# Patient Record
Sex: Male | Born: 1985 | Race: White | Hispanic: No | Marital: Single | Smoking: Current some day smoker
Health system: Southern US, Community
[De-identification: ages and names within clinical notes are randomized; demographics above are authoritative.]

## PROBLEM LIST (undated history)

## (undated) DIAGNOSIS — R079 Chest pain, unspecified: Secondary | ICD-10-CM

## (undated) DIAGNOSIS — R011 Cardiac murmur, unspecified: Secondary | ICD-10-CM

## (undated) DIAGNOSIS — I10 Essential (primary) hypertension: Secondary | ICD-10-CM

## (undated) DIAGNOSIS — K219 Gastro-esophageal reflux disease without esophagitis: Secondary | ICD-10-CM

## (undated) HISTORY — DX: Chest pain, unspecified: R07.9

## (undated) HISTORY — DX: Essential (primary) hypertension: I10

## (undated) HISTORY — DX: Cardiac murmur, unspecified: R01.1

---

## 2010-03-12 ENCOUNTER — Emergency Department (HOSPITAL_COMMUNITY): Admission: EM | Admit: 2010-03-12 | Discharge: 2010-03-12 | Payer: Self-pay | Admitting: Emergency Medicine

## 2010-08-29 LAB — DIFFERENTIAL
Basophils Relative: 1 % (ref 0–1)
Eosinophils Absolute: 0.2 10*3/uL (ref 0.0–0.7)
Eosinophils Relative: 3 % (ref 0–5)
Lymphs Abs: 2.3 10*3/uL (ref 0.7–4.0)
Monocytes Relative: 8 % (ref 3–12)

## 2010-08-29 LAB — BASIC METABOLIC PANEL
CO2: 27 mEq/L (ref 19–32)
Calcium: 10 mg/dL (ref 8.4–10.5)
Chloride: 106 mEq/L (ref 96–112)
Creatinine, Ser: 0.79 mg/dL (ref 0.4–1.5)
GFR calc Af Amer: >60 mL/min (ref >60)
Glucose, Bld: 122 mg/dL — ABNORMAL HIGH (ref 70–99)

## 2010-08-29 LAB — CBC
MCH: 28.9 pg (ref 26.0–34.0)
MCHC: 34.2 g/dL (ref 30.0–36.0)
MCV: 84.5 fL (ref 78.0–100.0)
Platelets: 230 10*3/uL (ref 150–400)
RBC: 5.29 MIL/uL (ref 4.22–5.81)

## 2010-08-29 LAB — POCT CARDIAC MARKERS

## 2013-03-09 ENCOUNTER — Encounter: Payer: Self-pay | Admitting: Cardiology

## 2013-03-09 ENCOUNTER — Ambulatory Visit (INDEPENDENT_AMBULATORY_CARE_PROVIDER_SITE_OTHER): Payer: 59 | Admitting: Cardiology

## 2013-03-09 VITALS — BP 140/96 | HR 88 | Ht 70.0 in | Wt 157.8 lb

## 2013-03-09 DIAGNOSIS — R079 Chest pain, unspecified: Secondary | ICD-10-CM

## 2013-03-09 NOTE — Progress Notes (Signed)
Patient ID: Cody Fisher, male   DOB: [birth year], 27 y.o.   MRN: [id number]    Patient Name: Cody Fisher Date of Encounter: 03/09/2013  Primary Care Provider:  Cain Saupe, MD Primary Cardiologist:  Tobias Alexander, H  Patient Profile  A pleasant 27 year old who is coming for chest pain. The pain started 3 years ago when he went to the ER. He was sent home with Ibuprofen for presumed pericarditis that gave him some relief. He doesn't recall any viral infection or fevers prior to this episode. He has been having the same pains again and they come in cluster of weeks. They are on and off every few minute, sharp precordial, or left sided, non-radiating, not associated with SOB or palpitations. They only last few seconds, and are worse at night.   Problem List   Past Medical History  Diagnosis Date  . Chest pain   . Hypertension     Borderline  . Heart murmur     As a Child   History reviewed. No pertinent past surgical history.  Allergies  No Known Allergies  HPI  o syncopal episode. The patient is active, riding bike to work. He is watching his diet closely.  Home Medications  Prior to Admission medications   Medication Sig Start Date End Date Taking? Authorizing Provider  acetaminophen (TYLENOL) 325 MG tablet Take 650 mg by mouth as needed for pain.   Yes Historical Provider, MD  aspirin 325 MG tablet Take 325 mg by mouth as needed for pain.   Yes Historical Provider, MD  Calcium Carbonate Antacid (TUMS ULTRA PO) Take by mouth as needed.   Yes Historical Provider, MD  dexlansoprazole (DEXILANT) 60 MG capsule Take 60 mg by mouth daily.   Yes Historical Provider, MD    Family History  History reviewed. No pertinent family history.  Social History  History   Social History  . Marital Status: Single    Spouse Name: N/A    Number of Children: N/A  . Years of Education: N/A   Occupational History  . Not on file.   Social History Main Topics  . Smoking  status: Former Games developer  . Smokeless tobacco: Not on file     Comment: Quit 6 months ago 2014  . Alcohol Use: Not on file  . Drug Use: Not on file  . Sexual Activity: Not on file   Other Topics Concern  . Not on file   Social History Narrative  . No narrative on file     Review of Systems General:  No chills, fever, night sweats or weight changes.  Cardiovascular:  + chest pain, dyspnea on exertion, edema, orthopnea, + palpitations, paroxysmal nocturnal dyspnea. Dermatological: No rash, lesions/masses Respiratory: No cough, dyspnea Urologic: No hematuria, dysuria Abdominal:   No nausea, vomiting, diarrhea, bright red blood per rectum, melena, or hematemesis Neurologic:  No visual changes, wkns, changes in mental status. All other systems reviewed and are otherwise negative except as noted above.  Physical Exam  Blood pressure 140/96, pulse 88, height 5\' 10"  (1.778 m), weight 157 lb 12.8 oz (71.578 kg).  General: Pleasant, NAD Psych: Normal affect. Neuro: Alert and oriented X 3. Moves all extremities spontaneously. HEENT: Normal  Neck: Supple without bruits or JVD. Lungs:  Resp regular and unlabored, CTA. Heart: RRR no s3, s4, or murmurs. Abdomen: Soft, non-tender, non-distended, BS + x 4.  Extremities: No clubbing, cyanosis or edema. DP/PT/Radials 2+ and equal bilaterally.  Accessory Clinical Findings  ECG -  SR, 72 BPM, early repolarization in lateral leads   Assessment & Plan  27 year old male  1. Atypical chest pain - the character is concerning for possible pericarditis vs GERD. There got some relief from Ibuprofen 3 years ago and now some relief from PPIs. Both diseases get worse at supine position. We will continue PPIs and  Order a TTE to assess for possible pericardial disease.  2. Hypertension - elevated on 2 separate doctors visits, he is asked to check as often as possible until the next visit. If elevated at the next appointment we will start him on BP  medication. He was counseled on proper diet and exercise.  3. Elevated glucose - on 1 occasion, the patient is concerned as many family members with diabetes. We will recheck glucose and HbA1c.  Follow up in 3 weeks    Tobias Alexander, Rexene Edison, MD 03/09/2013, 4:00 PM

## 2013-03-09 NOTE — Patient Instructions (Addendum)
Your physician recommends that have for lab work today: BMET/A1c  Your physician has requested that you have an echocardiogram. Echocardiography is a painless test that uses sound waves to create images of your heart. It provides your doctor with information about the size and shape of your heart and how well your heart's chambers and valves are working. This procedure takes approximately one hour. There are no restrictions for this procedure.  Your physician recommends that you schedule a follow-up appointment in: 3 weeks with Dr. Delton See  Your physician recommends that you continue on your current medications as directed. Please refer to the Current Medication list given to you today.

## 2013-03-10 LAB — BASIC METABOLIC PANEL
BUN: 12 mg/dL (ref 6–23)
CO2: 29 mEq/L (ref 19–32)
Calcium: 9.8 mg/dL (ref 8.4–10.5)
Chloride: 106 mEq/L (ref 96–112)
Creatinine, Ser: 0.9 mg/dL (ref 0.4–1.5)
GFR: 110.43 mL/min (ref >60.00)
Glucose, Bld: 83 mg/dL (ref 70–99)
Potassium: 4.2 mEq/L (ref 3.5–5.1)
Sodium: 140 mEq/L (ref 135–145)

## 2013-03-10 LAB — HEMOGLOBIN A1C: Hgb A1c MFr Bld: 5.6 % (ref 4.6–6.5)

## 2013-03-22 ENCOUNTER — Other Ambulatory Visit (HOSPITAL_COMMUNITY): Payer: Self-pay | Admitting: Cardiology

## 2013-03-22 ENCOUNTER — Ambulatory Visit (HOSPITAL_COMMUNITY): Payer: 59 | Attending: Cardiology | Admitting: Radiology

## 2013-03-22 DIAGNOSIS — R072 Precordial pain: Secondary | ICD-10-CM | POA: Insufficient documentation

## 2013-03-22 DIAGNOSIS — I10 Essential (primary) hypertension: Secondary | ICD-10-CM | POA: Insufficient documentation

## 2013-03-22 DIAGNOSIS — R079 Chest pain, unspecified: Secondary | ICD-10-CM

## 2013-03-22 NOTE — Progress Notes (Signed)
Echocardiogram performed.  

## 2013-03-29 ENCOUNTER — Encounter: Payer: Self-pay | Admitting: Cardiology

## 2013-04-01 ENCOUNTER — Ambulatory Visit: Payer: 59 | Admitting: Cardiology

## 2013-04-06 ENCOUNTER — Ambulatory Visit: Payer: 59 | Admitting: Cardiology

## 2013-04-11 ENCOUNTER — Encounter: Payer: Self-pay | Admitting: Cardiology

## 2013-04-11 ENCOUNTER — Ambulatory Visit (INDEPENDENT_AMBULATORY_CARE_PROVIDER_SITE_OTHER): Payer: 59 | Admitting: Cardiology

## 2013-04-11 VITALS — BP 168/48 | HR 94 | Ht 70.0 in | Wt 167.0 lb

## 2013-04-11 DIAGNOSIS — R079 Chest pain, unspecified: Secondary | ICD-10-CM

## 2013-04-11 MED ORDER — AMLODIPINE BESYLATE 2.5 MG PO TABS: 2.5000 mg | ORAL_TABLET | Freq: Every day | ORAL | Status: DC

## 2013-04-11 NOTE — Patient Instructions (Signed)
**Note De-Identified Cody Fisher Obfuscation** Your physician has requested that you have an exercise tolerance test. For further information please visit [email or web address] Please also follow instruction sheet, as given.  Your physician has recommended you make the following change in your medication: start taking Amlodipine 2.5 mg daily  Your physician recommends that you schedule a follow-up appointment in: after GXT

## 2013-04-11 NOTE — Progress Notes (Signed)
Patient ID: Cody Fisher, male   DOB: [birth year], 27 y.o.   MRN: [id number]  Patient ID: Cody Fisher, male   DOB: [birth year], 27 y.o.   MRN: [id number]    Patient Name: Cody Fisher Date of Encounter: 04/11/2013  Primary Care Provider:  Cain Saupe, MD Primary Cardiologist:  Tobias Alexander, H  Patient Profile  A pleasant 27 year old who is coming for chest pain. The pain started 3 years ago when he went to the ER. He was sent home with Ibuprofen for presumed pericarditis that gave him some relief. He doesn't recall any viral infection or fevers prior to this episode. He has been having the same pains again and they come in cluster of weeks. They are on and off every few minute, sharp precordial, or left sided, non-radiating, not associated with SOB or palpitations. They only last few seconds, and are worse at night.   Problem List   Past Medical History  Diagnosis Date  . Chest pain   . Hypertension     Borderline  . Heart murmur     As a Child   No past surgical history on file.  Allergies  No Known Allergies  HPI  A pleasant 27 year old who is coming for chest pain. The pain started 3 years ago when he went to the ER. He was sent home with Ibuprofen for presumed pericarditis that gave him some relief. He doesn't recall any viral infection or fevers prior to this episode. He has been having the same pains again and they come in cluster of weeks. They are on and off every few minute, sharp precordial, or left sided, non-radiating, not associated with SOB or palpitations. They only last few seconds, and are worse at night.   Follow up after 3 weeks, no more episodes of chest pain.   Home Medications  Prior to Admission medications   Medication Sig Start Date End Date Taking? Authorizing Provider  acetaminophen (TYLENOL) 325 MG tablet Take 650 mg by mouth as needed for pain.   Yes Historical Provider, MD  aspirin 325 MG tablet Take 325 mg by mouth as needed for pain.    Yes Historical Provider, MD  Calcium Carbonate Antacid (TUMS ULTRA PO) Take by mouth as needed.   Yes Historical Provider, MD  dexlansoprazole (DEXILANT) 60 MG capsule Take 60 mg by mouth daily.   Yes Historical Provider, MD    Family History  No family history on file.  Social History  History   Social History  . Marital Status: Single    Spouse Name: N/A    Number of Children: N/A  . Years of Education: N/A   Occupational History  . Not on file.   Social History Main Topics  . Smoking status: Former Games developer  . Smokeless tobacco: Not on file     Comment: Quit 6 months ago 2014  . Alcohol Use: Not on file  . Drug Use: Not on file  . Sexual Activity: Not on file   Other Topics Concern  . Not on file   Social History Narrative  . No narrative on file     Review of Systems General:  No chills, fever, night sweats or weight changes.  Cardiovascular:  + chest pain, dyspnea on exertion, edema, orthopnea, + palpitations, paroxysmal nocturnal dyspnea. Dermatological: No rash, lesions/masses Respiratory: No cough, dyspnea Urologic: No hematuria, dysuria Abdominal:   No nausea, vomiting, diarrhea, bright red blood per rectum, melena, or hematemesis Neurologic:  No visual  changes, wkns, changes in mental status. All other systems reviewed and are otherwise negative except as noted above.  Physical Exam  Blood pressure 168/48, pulse 94, height 5\' 10"  (1.778 m), weight 167 lb (75.751 kg).  General: Pleasant, NAD Psych: Normal affect. Neuro: Alert and oriented X 3. Moves all extremities spontaneously. HEENT: Normal  Neck: Supple without bruits or JVD. Lungs:  Resp regular and unlabored, CTA. Heart: RRR no s3, s4, or murmurs. Abdomen: Soft, non-tender, non-distended, BS + x 4.  Extremities: No clubbing, cyanosis or edema. DP/PT/Radials 2+ and equal bilaterally.  Accessory Clinical Findings  ECG - SR, 72 BPM, early repolarization in lateral leads  TTE  03/22/2013 ------------------------------------------------------------ Left ventricle: The cavity size was normal. Wall thickness was normal. Systolic function was normal. The estimated ejection fraction was in the range of 60% to 65%. Wall motion was normal; there were no regional wall motion abnormalities. The transmitral flow pattern was normal. The deceleration time of the early transmitral flow velocity was normal. The pulmonary vein flow pattern was normal. The tissue Doppler parameters were normal. Left ventricular diastolic function parameters were normal. ------------------------------------------------------------ Aortic valve: Structurally normal valve. Cusp separation was normal. Doppler: Transvalvular velocity was within the normal range. There was no stenosis. No significant regurgitation. ------------------------------------------------------------ Aorta: The aorta was normal, not dilated, and non-diseased. ------------------------------------------------------------ Mitral valve: Structurally normal valve. Leaflet separation was normal. Doppler: Transvalvular velocity was within the normal range. There was no evidence for stenosis. No significant regurgitation. Peak gradient: 3mm Hg (D). ------------------------------------------------------------ Left atrium: The atrium was normal in size. ------------------------------------------------------------ Atrial septum: No defect or patent foramen ovale was identified. ------------------------------------------------------------ Right ventricle: The cavity size was normal. Systolic function was normal. ------------------------------------------------------------ Pulmonic valve: Structurally normal valve. Cusp separation was normal. Doppler: Transvalvular velocity was within the normal range. No significant regurgitation. ------------------------------------------------------------ Tricuspid valve: Structurally normal  valve. Leaflet separation was normal. Doppler: Transvalvular velocity was within the normal range. Trivial regurgitation.  ------------------------------------------------------------ Pulmonary artery: Systolic pressure was within the normal range. ------------------------------------------------------------ Right atrium: The atrium was normal in size. ------------------------------------------------------------ Pericardium: There was no pericardial effusion. ------------------------------------------------------------ Systemic veins: Inferior vena cava: The vessel was normal in size; the respirophasic diameter changes were in the normal range (= 50%); findings are consistent with normal central venous pressure. ------------------------------------------------------------ Post procedure conclusions Ascending Aorta: - The aorta was normal, not dilated, and non-diseased.    Assessment & Plan  27 year old male  1. Hypertension - elevated on 3 separate doctors visits including today. The patient arrived on a bike, however the BO checked 20 minutes after his arrival and still elevated at 168/48. He was counseled on proper diet and exercise at the last visit. In fact, the patient rarely eats out and prepares healthy food on his own. He also exercises on a regular basis. We will start the patient on amlodipine 2.5 mg po daily and schedule an exercise treadmill test 1 month from now to evaluate for the blood pressure response with exercise.  2. Atypical chest pain - the character is concerning for possible pericarditis vs GERD. Now resolved. We will continue PPIs. No pericardial effusion on TTE.Marland Kitchen Elevated glucose - on 1 occasion, the patient is concerned as many family members with diabetes. HbA1c normal  Follow up in 6 weeks.  Tobias Alexander, Rexene Edison, MD 04/11/2013, 2:31 PM

## 2013-05-17 ENCOUNTER — Ambulatory Visit (INDEPENDENT_AMBULATORY_CARE_PROVIDER_SITE_OTHER): Payer: 59 | Admitting: Physician Assistant

## 2013-05-17 DIAGNOSIS — R079 Chest pain, unspecified: Secondary | ICD-10-CM

## 2013-05-17 NOTE — Progress Notes (Signed)
Exercise Treadmill Test  Pre-Exercise Testing Evaluation Rhythm: sinus tachycardia  Rate: 100 bpm     Test  Exercise Tolerance Test Ordering MD: Tobias Alexander, MD  Interpreting MD: Tereso Newcomer, PA-C  Unique Test No: 1  Treadmill:  1  Indication for ETT: chest pain - rule out ischemia  Contraindication to ETT: No   Stress Modality: exercise - treadmill  Cardiac Imaging Performed: non   Protocol: standard Bruce - maximal  Max BP:  204/63  Max MPHR (bpm):  193 85% MPR (bpm):  164  MPHR obtained (bpm):  193 % MPHR obtained:  100  Reached 85% MPHR (min:sec):  9:45 Total Exercise Time (min-sec):  12:00  Workload in METS:  13.4 Borg Scale: 15  Reason ETT Terminated:  desired heart rate attained    ST Segment Analysis At Rest: normal ST segments - no evidence of significant ST depression With Exercise: no evidence of significant ST depression  Other Information Arrhythmia:  No Angina during ETT:  absent (0) Quality of ETT:  diagnostic  ETT Interpretation:  normal - no evidence of ischemia by ST analysis  Comments: Excellent exercise capacity. No chest pain. Normal BP response to exercise.  Baseline BP 140/87 => 204/63 (at peak exercise). No ST changes to suggest ischemia.   Recommendations: F/u with Dr. Tobias Alexander as directed. Signed,  Tereso Newcomer, PA-C   05/17/2013 3:36 PM

## 2013-06-03 ENCOUNTER — Encounter: Payer: Self-pay | Admitting: Cardiology

## 2013-06-03 ENCOUNTER — Ambulatory Visit (INDEPENDENT_AMBULATORY_CARE_PROVIDER_SITE_OTHER): Payer: 59 | Admitting: Cardiology

## 2013-06-03 VITALS — BP 145/85 | HR 81 | Ht 70.0 in | Wt 174.0 lb

## 2013-06-03 DIAGNOSIS — I10 Essential (primary) hypertension: Secondary | ICD-10-CM

## 2013-06-03 DIAGNOSIS — R079 Chest pain, unspecified: Secondary | ICD-10-CM

## 2013-06-03 HISTORY — DX: Essential (primary) hypertension: I10

## 2013-06-03 MED ORDER — AMLODIPINE BESYLATE 5 MG PO TABS: 5.0000 mg | ORAL_TABLET | Freq: Every day | ORAL | Status: DC

## 2013-06-03 NOTE — Patient Instructions (Signed)
Increase Amlodipine to 5 mg daily   Your physician recommends that you schedule a follow-up appointment in: 2 months 07/20/13 at 2:00 pm

## 2013-06-03 NOTE — Progress Notes (Signed)
Patient ID: Cody Fisher, male   DOB: January 09, 1986, 27 y.o.   MRN: [id number]     Patient Name: Cody Fisher Date of Encounter: 06/03/2013  Primary Care Provider:  Cain Saupe, MD Primary Cardiologist:  Tobias Alexander, H  Patient Profile  A pleasant 27 year old who is coming for chest pain. The pain started 3 years ago when he went to the ER. He was sent home with Ibuprofen for presumed pericarditis that gave him some relief. He doesn't recall any viral infection or fevers prior to this episode. He has been having the same pains again and they come in cluster of weeks. They are on and off every few minute, sharp precordial, or left sided, non-radiating, not associated with SOB or palpitations. They only last few seconds, and are worse at night.   Problem List   Past Medical History  Diagnosis Date  . Chest pain   . Hypertension     Borderline  . Heart murmur     As a Child   No past surgical history on file.  Allergies  No Known Allergies  HPI  A pleasant 27 year old who is coming for chest pain. The pain started 3 years ago when he went to the ER. He was sent home with Ibuprofen for presumed pericarditis that gave him some relief. He doesn't recall any viral infection or fevers prior to this episode. He has been having the same pains again and they come in cluster of weeks. They are on and off every few minute, sharp precordial, or left sided, non-radiating, not associated with SOB or palpitations. They only last few seconds, and are worse at night.   Follow up after 6 weeks, no more episodes of chest pain. The patient started strenous exercise - daily alternating cardio and weight lifting. In addition he bikes daily.   Home Medications  Prior to Admission medications   Medication Sig Start Date End Date Taking? Authorizing Provider  acetaminophen (TYLENOL) 325 MG tablet Take 650 mg by mouth as needed for pain.   Yes Historical Provider, MD  aspirin 325 MG tablet Take  325 mg by mouth as needed for pain.   Yes Historical Provider, MD  Calcium Carbonate Antacid (TUMS ULTRA PO) Take by mouth as needed.   Yes Historical Provider, MD  dexlansoprazole (DEXILANT) 60 MG capsule Take 60 mg by mouth daily.   Yes Historical Provider, MD    Family History  No family history on file.  Social History  History   Social History  . Marital Status: Single    Spouse Name: N/A    Number of Children: N/A  . Years of Education: N/A   Occupational History  . Not on file.   Social History Main Topics  . Smoking status: Former Games developer  . Smokeless tobacco: Not on file     Comment: Quit 6 months ago 2014  . Alcohol Use: Not on file  . Drug Use: Not on file  . Sexual Activity: Not on file   Other Topics Concern  . Not on file   Social History Narrative  . No narrative on file     Review of Systems General:  No chills, fever, night sweats or weight changes.  Cardiovascular:  + chest pain, dyspnea on exertion, edema, orthopnea, + palpitations, paroxysmal nocturnal dyspnea. Dermatological: No rash, lesions/masses Respiratory: No cough, dyspnea Urologic: No hematuria, dysuria Abdominal:   No nausea, vomiting, diarrhea, bright red blood per rectum, melena, or hematemesis Neurologic:  No visual changes, wkns, changes in mental status. All other systems reviewed and are otherwise negative except as noted above.  Physical Exam  BP 145/85, 81 BPM General: Pleasant, NAD Psych: Normal affect. Neuro: Alert and oriented X 3. Moves all extremities spontaneously. HEENT: Normal  Neck: Supple without bruits or JVD. Lungs:  Resp regular and unlabored, CTA. Heart: RRR no s3, s4, or murmurs. Abdomen: Soft, non-tender, non-distended, BS + x 4.  Extremities: No clubbing, cyanosis or edema. DP/PT/Radials 2+ and equal bilaterally.  Accessory Clinical Findings  ECG - SR, 72 BPM, early repolarization in lateral leads  TTE  03/22/2013 ------------------------------------------------------------ Left ventricle: The cavity size was normal. Wall thickness was normal. Systolic function was normal. The estimated ejection fraction was in the range of 60% to 65%. Wall motion was normal; there were no regional wall motion abnormalities. The transmitral flow pattern was normal. The deceleration time of the early transmitral flow velocity was normal. The pulmonary vein flow pattern was normal. The tissue Doppler parameters were normal. Left ventricular diastolic function parameters were normal. ------------------------------------------------------------ Aortic valve: Structurally normal valve. Cusp separation was normal. Doppler: Transvalvular velocity was within the normal range. There was no stenosis. No significant regurgitation. ------------------------------------------------------------ Aorta: The aorta was normal, not dilated, and non-diseased. ------------------------------------------------------------ Mitral valve: Structurally normal valve. Leaflet separation was normal. Doppler: Transvalvular velocity was within the normal range. There was no evidence for stenosis. No significant regurgitation. Peak gradient: 3mm Hg (D). ------------------------------------------------------------ Left atrium: The atrium was normal in size. ------------------------------------------------------------ Atrial septum: No defect or patent foramen ovale was identified. ------------------------------------------------------------ Right ventricle: The cavity size was normal. Systolic function was normal. ------------------------------------------------------------ Pulmonic valve: Structurally normal valve. Cusp separation was normal. Doppler: Transvalvular velocity was within the normal range. No significant regurgitation. ------------------------------------------------------------ Tricuspid valve: Structurally normal  valve. Leaflet separation was normal. Doppler: Transvalvular velocity was within the normal range. Trivial regurgitation.  ------------------------------------------------------------ Pulmonary artery: Systolic pressure was within the normal range. ------------------------------------------------------------ Right atrium: The atrium was normal in size. ------------------------------------------------------------ Pericardium: There was no pericardial effusion. ------------------------------------------------------------ Systemic veins: Inferior vena cava: The vessel was normal in size; the respirophasic diameter changes were in the normal range (= 50%); findings are consistent with normal central venous pressure. ------------------------------------------------------------ Post procedure conclusions Ascending Aorta: - The aorta was normal, not dilated, and non-diseased.   Exercise treadmill stress test: 05/17/2013 ETT Interpretation:  normal - no evidence of ischemia by ST analysis  Comments: Excellent exercise capacity. No chest pain. Normal BP response to exercise.  Baseline BP 140/87 => 204/63 (at peak exercise). No ST changes to suggest ischemia.   Recommendations: F/u with Dr. Tobias Alexander as directed. Signed,  Tereso Newcomer, PA-C   05/17/2013 3:36 PM      Assessment & Plan  27 year old male  1. Hypertension - elevated on 3 separate doctors visits including today. The patient arrived on a bike, however the BO checked 20 minutes after his arrival and still elevated at 168/48. He was counseled on proper diet and exercise at the last visit. In fact, the patient rarely eats out and prepares healthy food on his own. He also exercises on a regular basis.  We started the patient on amlodipine 2.5 mg po daily and he underwent an exercise treadmill test 1 month later that showed baseline BP 140/87 => 204/63 (at peak exercise).   We will increase amlodipine to 5 mg po daily.  We will follow up in 6 weeks.  Repeat treadmill test to assess BP response at  stress in 3 months (after 3 monrhs of strenuous exercise).  He will bring BP diary from different times of the day and from gym.  2. Atypical chest pain - the character is concerning for possible pericarditis vs GERD. Now resolved. We will continue PPIs. No pericardial effusion on TTE.Marland Kitchen Elevated glucose - on 1 occasion, the patient is concerned as many family members with diabetes. HbA1c normal. No ischemia on exercise treadmill stress test.  Follow up in 6 weeks.  Tobias Alexander, Rexene Edison, MD 06/03/2013, 12:17 PM

## 2013-07-12 ENCOUNTER — Encounter: Payer: Self-pay | Admitting: *Deleted

## 2013-07-20 ENCOUNTER — Ambulatory Visit (INDEPENDENT_AMBULATORY_CARE_PROVIDER_SITE_OTHER): Payer: 59 | Admitting: Cardiology

## 2013-07-20 ENCOUNTER — Encounter: Payer: Self-pay | Admitting: Cardiology

## 2013-07-20 VITALS — BP 128/70 | HR 61 | Ht 70.0 in | Wt 169.8 lb

## 2013-07-20 DIAGNOSIS — I10 Essential (primary) hypertension: Secondary | ICD-10-CM

## 2013-07-20 NOTE — Progress Notes (Signed)
Patient ID: Kadon Andrus, male   DOB: [birth year], 28 y.o.   MRN: [id number]     Patient Name: Adams Hinch Date of Encounter: 07/20/2013  Primary Care Provider:  Cain Saupe, MD Primary Cardiologist:  Tobias Alexander, H  Patient Profile  A pleasant 28 year old who is coming for chest pain. The pain started 3 years ago when he went to the ER. He was sent home with Ibuprofen for presumed pericarditis that gave him some relief. He doesn't recall any viral infection or fevers prior to this episode. He has been having the same pains again and they come in cluster of weeks. They are on and off every few minute, sharp precordial, or left sided, non-radiating, not associated with SOB or palpitations. They only last few seconds, and are worse at night.   Problem List   Past Medical History  Diagnosis Date  . Chest pain   . Hypertension     Borderline  . Heart murmur     As a Child  . Essential hypertension 06/03/2013   No past surgical history on file.  Allergies  No Known Allergies  HPI  A pleasant 28 year old who is coming for chest pain. The pain started 3 years ago when he went to the ER. He was sent home with Ibuprofen for presumed pericarditis that gave him some relief. He doesn't recall any viral infection or fevers prior to this episode. He has been having the same pains again and they come in cluster of weeks. They are on and off every few minute, sharp precordial, or left sided, non-radiating, not associated with SOB or palpitations. They only last few seconds, and are worse at night.   Follow up after 6 weeks, no more episodes of chest pain. The patient started strenous exercise - daily alternating cardio and weight lifting. In addition he bikes daily.  The patient is coming after one months he states that he feels great. He started an intense exercise program where every other day he exercises for about an hour, he alternates resistance training for about half an hour with  5K running for half an hour. On weekends he runs 10 K. The patient inquiries about further exercise training he would like to run a half Marathon and potentially full Marathon later this year. He wants more information with regards to his heart disease and high blood pressure.  Home Medications  Prior to Admission medications   Medication Sig Start Date End Date Taking? Authorizing Provider  acetaminophen (TYLENOL) 325 MG tablet Take 650 mg by mouth as needed for pain.   Yes Historical Provider, MD  aspirin 325 MG tablet Take 325 mg by mouth as needed for pain.   Yes Historical Provider, MD  Calcium Carbonate Antacid (TUMS ULTRA PO) Take by mouth as needed.   Yes Historical Provider, MD  dexlansoprazole (DEXILANT) 60 MG capsule Take 60 mg by mouth daily.   Yes Historical Provider, MD    Family History  Family History  Problem Relation Age of Onset  . Diabetes      family history    Social History  History   Social History  . Marital Status: Single    Spouse Name: N/A    Number of Children: N/A  . Years of Education: N/A   Occupational History  . Not on file.   Social History Main Topics  . Smoking status: Former Games developer  . Smokeless tobacco: Never Used     Comment: Quit 2014  .  Alcohol Use: No  . Drug Use: No  . Sexual Activity: Not on file   Other Topics Concern  . Not on file   Social History Narrative  . No narrative on file     Review of Systems General:  No chills, fever, night sweats or weight changes.  Cardiovascular:  + chest pain, dyspnea on exertion, edema, orthopnea, + palpitations, paroxysmal nocturnal dyspnea. Dermatological: No rash, lesions/masses Respiratory: No cough, dyspnea Urologic: No hematuria, dysuria Abdominal:   No nausea, vomiting, diarrhea, bright red blood per rectum, melena, or hematemesis Neurologic:  No visual changes, wkns, changes in mental status. All other systems reviewed and are otherwise negative except as noted  above.  Physical Exam  BP 145/85, 81 BPM General: Pleasant, NAD Psych: Normal affect. Neuro: Alert and oriented X 3. Moves all extremities spontaneously. HEENT: Normal  Neck: Supple without bruits or JVD. Lungs:  Resp regular and unlabored, CTA. Heart: RRR no s3, s4, or murmurs. Abdomen: Soft, non-tender, non-distended, BS + x 4.  Extremities: No clubbing, cyanosis or edema. DP/PT/Radials 2+ and equal bilaterally.  Accessory Clinical Findings  ECG - SR, 72 BPM, early repolarization in lateral leads  TTE 03/22/2013 ------------------------------------------------------------ Left ventricle: The cavity size was normal. Wall thickness was normal. Systolic function was normal. The estimated ejection fraction was in the range of 60% to 65%. Wall motion was normal; there were no regional wall motion abnormalities. The transmitral flow pattern was normal. The deceleration time of the early transmitral flow velocity was normal. The pulmonary vein flow pattern was normal. The tissue Doppler parameters were normal. Left ventricular diastolic function parameters were normal. ------------------------------------------------------------ Aortic valve: Structurally normal valve. Cusp separation was normal. Doppler: Transvalvular velocity was within the normal range. There was no stenosis. No significant regurgitation. ------------------------------------------------------------ Aorta: The aorta was normal, not dilated, and non-diseased. ------------------------------------------------------------ Mitral valve: Structurally normal valve. Leaflet separation was normal. Doppler: Transvalvular velocity was within the normal range. There was no evidence for stenosis. No significant regurgitation. Peak gradient: 3mm Hg (D). ------------------------------------------------------------ Left atrium: The atrium was normal in size. ------------------------------------------------------------ Atrial  septum: No defect or patent foramen ovale was identified. ------------------------------------------------------------ Right ventricle: The cavity size was normal. Systolic function was normal. ------------------------------------------------------------ Pulmonic valve: Structurally normal valve. Cusp separation was normal. Doppler: Transvalvular velocity was within the normal range. No significant regurgitation. ------------------------------------------------------------ Tricuspid valve: Structurally normal valve. Leaflet separation was normal. Doppler: Transvalvular velocity was within the normal range. Trivial regurgitation.  ------------------------------------------------------------ Pulmonary artery: Systolic pressure was within the normal range. ------------------------------------------------------------ Right atrium: The atrium was normal in size. ------------------------------------------------------------ Pericardium: There was no pericardial effusion. ------------------------------------------------------------ Systemic veins: Inferior vena cava: The vessel was normal in size; the respirophasic diameter changes were in the normal range (= 50%); findings are consistent with normal central venous pressure. ------------------------------------------------------------ Post procedure conclusions Ascending Aorta: - The aorta was normal, not dilated, and non-diseased.   Exercise treadmill stress test: 05/17/2013 ETT Interpretation:  normal - no evidence of ischemia by ST analysis  Comments: Excellent exercise capacity. No chest pain. Normal BP response to exercise.  Baseline BP 140/87 => 204/63 (at peak exercise). No ST changes to suggest ischemia.   Recommendations: F/u with Dr. Tobias Alexander as directed. Signed,  Tereso Newcomer, PA-C   05/17/2013 3:36 PM      Assessment & Plan  28 year old male  1. Hypertension - elevated on 3 separate doctors visits  including today. The patient arrived on a bike, however the BO checked 20 minutes after  his arrival and still elevated at 168/48. He was counseled on proper diet and exercise at the last visit. In fact, the patient rarely eats out and prepares healthy food on his own. He also exercises on a regular basis.  We started the patient on amlodipine 2.5 mg po daily and he underwent an exercise treadmill test 1 month later that showed baseline BP 140/87 => 204/63 (at peak exercise).   BP controlled with amlodipine, improvement of symptoms.   BP diary shows 120-140/80-90 range.   I had a lengthy discussion with the patient in regards to proper training and preparing for half Marathon. Since the patient had normal echocardiogram with no LVH, he is encouraged to further running and exercising in general. Before he runs his half marathon we might consider performing another treadmill test to see how his blood pressure responses to high level of exercise. I expected his blood pressure will further improved with her regular exercise. The patient will contact us in about 6 months before he runs his first long phrase  2. Atypical chest pain - the character is concerning for possible pericarditis vs GERD. Now resolved. We will continue PPIs. No pericardial effusion on TTE.Marland Kitchen. Elevated glucose - on 1 occasion, the patient is concerned as many family members with diabetes. HbA1c normal. No ischemia on exercise treadmill stress test.  Tobias AlexanderNELSON, Lafonda Patron, Rexene EdisonH, MD 07/20/2013, 2:18 PM

## 2013-07-20 NOTE — Patient Instructions (Signed)
**Note De-identified Freddie Nghiem Obfuscation** Your physician recommends that you continue on your current medications as directed. Please refer to the Current Medication list given to you today.  Your physician wants you to follow-up in: 6 months. You will receive a reminder letter in the mail two months in advance. If you don't receive a letter, please call our office to schedule the follow-up appointment.  

## 2013-12-09 ENCOUNTER — Other Ambulatory Visit: Payer: Self-pay | Admitting: Gastroenterology

## 2013-12-09 DIAGNOSIS — R109 Unspecified abdominal pain: Secondary | ICD-10-CM

## 2013-12-09 DIAGNOSIS — R079 Chest pain, unspecified: Secondary | ICD-10-CM

## 2013-12-15 ENCOUNTER — Encounter: Payer: Self-pay | Admitting: Cardiology

## 2013-12-15 ENCOUNTER — Ambulatory Visit (INDEPENDENT_AMBULATORY_CARE_PROVIDER_SITE_OTHER): Payer: 59 | Admitting: Cardiology

## 2013-12-15 ENCOUNTER — Encounter (INDEPENDENT_AMBULATORY_CARE_PROVIDER_SITE_OTHER): Payer: Self-pay

## 2013-12-15 VITALS — BP 133/82 | HR 70 | Ht 70.0 in | Wt 171.0 lb

## 2013-12-15 DIAGNOSIS — I10 Essential (primary) hypertension: Secondary | ICD-10-CM

## 2013-12-15 NOTE — Progress Notes (Signed)
Patient ID: Cody Fisher, male   DOB: 1985/11/27, 28 y.o.   MRN: [id number]     Patient Name: Cody Fisher Date of Encounter: 12/15/2013  Primary Care Provider:  Cain Saupe, MD Primary Cardiologist:  Lars Masson  Patient Profile  A pleasant 28 year old who is coming for chest pain. The pain started 3 years ago when he went to the ER. He was sent home with Ibuprofen for presumed pericarditis that gave him some relief. He doesn't recall any viral infection or fevers prior to this episode. He has been having the same pains again and they come in cluster of weeks. They are on and off every few minute, sharp precordial, or left sided, non-radiating, not associated with SOB or palpitations. They only last few seconds, and are worse at night.   Problem List   Past Medical History  Diagnosis Date  . Chest pain   . Hypertension     Borderline  . Heart murmur     As a Child  . Essential hypertension 06/03/2013   No past surgical history on file.  Allergies  No Known Allergies  HPI  A pleasant 28 year old who is coming for chest pain. The pain started 3 years ago when he went to the ER. He was sent home with Ibuprofen for presumed pericarditis that gave him some relief. He doesn't recall any viral infection or fevers prior to this episode. He has been having the same pains again and they come in cluster of weeks. They are on and off every few minute, sharp precordial, or left sided, non-radiating, not associated with SOB or palpitations. They only last few seconds, and are worse at night.   Follow up after 6 weeks, no more episodes of chest pain. The patient started strenous exercise - daily alternating cardio and weight lifting. In addition he bikes daily.  The patient is coming after one months he states that he feels great. He started an intense exercise program where every other day he exercises for about an hour, he alternates resistance training for about half an hour with  5K running for half an hour. On weekends he runs 10 K. The patient inquiries about further exercise training he would like to run a half Marathon and potentially full Marathon later this year. He wants more information with regards to his heart disease and high blood pressure.  12/15/2013 - resolution of chest pain since PPI started but only has been taking it for 5 days. He continues to exercise on a regular basis and is asymptomatic.   Home Medications  Prior to Admission medications   Medication Sig Start Date End Date Taking? Authorizing Provider  acetaminophen (TYLENOL) 325 MG tablet Take 650 mg by mouth as needed for pain.   Yes Historical Provider, MD  aspirin 325 MG tablet Take 325 mg by mouth as needed for pain.   Yes Historical Provider, MD  Calcium Carbonate Antacid (TUMS ULTRA PO) Take by mouth as needed.   Yes Historical Provider, MD  dexlansoprazole (DEXILANT) 60 MG capsule Take 60 mg by mouth daily.   Yes Historical Provider, MD    Family History  Family History  Problem Relation Age of Onset  . Diabetes      family history  . Hypertension Mother   . Diabetes Father     Social History  History   Social History  . Marital Status: Single    Spouse Name: N/A    Number of Children: N/A  .  Years of Education: N/A   Occupational History  . Not on file.   Social History Main Topics  . Smoking status: Former Games developermoker  . Smokeless tobacco: Never Used     Comment: Quit 2014  . Alcohol Use: No  . Drug Use: No  . Sexual Activity: Not on file   Other Topics Concern  . Not on file   Social History Narrative  . No narrative on file     Review of Systems General:  No chills, fever, night sweats or weight changes.  Cardiovascular:  + chest pain, dyspnea on exertion, edema, orthopnea, + palpitations, paroxysmal nocturnal dyspnea. Dermatological: No rash, lesions/masses Respiratory: No cough, dyspnea Urologic: No hematuria, dysuria Abdominal:   No nausea, vomiting,  diarrhea, bright red blood per rectum, melena, or hematemesis Neurologic:  No visual changes, wkns, changes in mental status. All other systems reviewed and are otherwise negative except as noted above.  Physical Exam  BP 145/85, 81 BPM General: Pleasant, NAD Psych: Normal affect. Neuro: Alert and oriented X 3. Moves all extremities spontaneously. HEENT: Normal  Neck: Supple without bruits or JVD. Lungs:  Resp regular and unlabored, CTA. Heart: RRR no s3, s4, or murmurs. Abdomen: Soft, non-tender, non-distended, BS + x 4.  Extremities: No clubbing, cyanosis or edema. DP/PT/Radials 2+ and equal bilaterally.  Accessory Clinical Findings  ECG - SR, 72 BPM, early repolarization in lateral leads  TTE 03/22/2013 ------------------------------------------------------------ Left ventricle: The cavity size was normal. Wall thickness was normal. Systolic function was normal. The estimated ejection fraction was in the range of 60% to 65%. Wall motion was normal; there were no regional wall motion abnormalities. The transmitral flow pattern was normal. The deceleration time of the early transmitral flow velocity was normal. The pulmonary vein flow pattern was normal. The tissue Doppler parameters were normal. Left ventricular diastolic function parameters were normal. ------------------------------------------------------------ Aortic valve: Structurally normal valve. Cusp separation was normal. Doppler: Transvalvular velocity was within the normal range. There was no stenosis. No significant regurgitation. ------------------------------------------------------------ Aorta: The aorta was normal, not dilated, and non-diseased. ------------------------------------------------------------ Mitral valve: Structurally normal valve. Leaflet separation was normal. Doppler: Transvalvular velocity was within the normal range. There was no evidence for stenosis. No significant regurgitation. Peak  gradient: 3mm Hg (D). ------------------------------------------------------------ Left atrium: The atrium was normal in size. ------------------------------------------------------------ Atrial septum: No defect or patent foramen ovale was identified. ------------------------------------------------------------ Right ventricle: The cavity size was normal. Systolic function was normal. ------------------------------------------------------------ Pulmonic valve: Structurally normal valve. Cusp separation was normal. Doppler: Transvalvular velocity was within the normal range. No significant regurgitation. ------------------------------------------------------------ Tricuspid valve: Structurally normal valve. Leaflet separation was normal. Doppler: Transvalvular velocity was within the normal range. Trivial regurgitation.  ------------------------------------------------------------ Pulmonary artery: Systolic pressure was within the normal range. ------------------------------------------------------------ Right atrium: The atrium was normal in size. ------------------------------------------------------------ Pericardium: There was no pericardial effusion. ------------------------------------------------------------ Systemic veins: Inferior vena cava: The vessel was normal in size; the respirophasic diameter changes were in the normal range (= 50%); findings are consistent with normal central venous pressure. ------------------------------------------------------------ Post procedure conclusions Ascending Aorta: - The aorta was normal, not dilated, and non-diseased.   Exercise treadmill stress test: 05/17/2013 ETT Interpretation:  normal - no evidence of ischemia by ST analysis  Comments: Excellent exercise capacity. No chest pain. Normal BP response to exercise.  Baseline BP 140/87 => 204/63 (at peak exercise). No ST changes to suggest ischemia.   Recommendations: F/u  with Dr. Tobias AlexanderKatarina Luciel Brickman as directed. Signed,  Tereso NewcomerScott Weaver, PA-C   05/17/2013  3:36 PM      Assessment & Plan  28 year old male  1. Hypertension - elevated on 3 separate doctors visits including today. The patient arrived on a bike, however the BO checked 20 minutes after his arrival and still elevated at 168/48. He was counseled on proper diet and exercise at the last visit. In fact, the patient rarely eats out and prepares healthy food on his own. He also exercises on a regular basis.  We started the patient on amlodipine 2.5 mg po daily and he underwent an exercise treadmill test 1 month later that showed baseline BP 140/87 => 204/63 (at peak exercise).   BP controlled with amlodipine, improvement of symptoms.   BP diary shows 120-140/80-90 range.   I had a lengthy discussion with the patient in regards to proper training and preparing for half Marathon. Since the patient had normal echocardiogram with no LVH, he is encouraged to further running and exercising in general. Before he runs his half marathon we might consider performing another treadmill test to see how his blood pressure responses to high level of exercise. I expected his blood pressure will further improved with her regular exercise. The patient will contact us in about 6 months before he runs his first long phrase  2. Atypical chest pain - the character is concerning for possible pericarditis vs GERD. Now resolved. We will continue PPIs. No pericardial effusion on TTE.Marland Kitchen. Elevated glucose - on 1 occasion, the patient is concerned as many family members with diabetes. HbA1c normal. No ischemia on exercise treadmill stress test.  We will schedule a repeat exercise treadmill stress test to evaluate for BP response in early September prior to running his 1. Half marathon. Follow up after the GXT.   Lars MassonNELSON, Jefte Carithers H, MD 12/15/2013, 10:31 AM

## 2013-12-15 NOTE — Patient Instructions (Signed)
Your physician recommends that you continue on your current medications as directed. Please refer to the Current Medication list given to you today.  Your physician has requested that you have an exercise tolerance test. For further information please visit [email or web address]. Please also follow instruction sheet, as given.  PLEASE HAVE THIS SCHEDULED FOR September   Your physician recommends that you schedule a follow-up appointment in: WITH DR Delton SeeNELSON [place]

## 2013-12-21 ENCOUNTER — Ambulatory Visit
Admission: RE | Admit: 2013-12-21 | Discharge: 2013-12-21 | Disposition: A | Discharge status: Home / Self Care | Payer: 59 | Source: Ambulatory Visit | Attending: Gastroenterology | Admitting: Gastroenterology

## 2013-12-21 DIAGNOSIS — R109 Unspecified abdominal pain: Secondary | ICD-10-CM

## 2013-12-21 DIAGNOSIS — R079 Chest pain, unspecified: Secondary | ICD-10-CM

## 2014-02-02 ENCOUNTER — Other Ambulatory Visit: Payer: Self-pay | Admitting: Cardiology

## 2014-02-15 ENCOUNTER — Ambulatory Visit (INDEPENDENT_AMBULATORY_CARE_PROVIDER_SITE_OTHER): Payer: 59 | Admitting: Physician Assistant

## 2014-02-15 DIAGNOSIS — I10 Essential (primary) hypertension: Secondary | ICD-10-CM

## 2014-02-15 NOTE — Progress Notes (Signed)
Exercise Treadmill Test  Pre-Exercise Testing Evaluation Rhythm: normal sinus  Rate: 93 bpm     Test  Exercise Tolerance Test Ordering MD: Tobias Alexander, MD  Interpreting MD: Tereso Newcomer, PA-C  Unique Test No: 1  Treadmill:  1  Indication for ETT: HTN  Contraindication to ETT: No   Stress Modality: exercise - treadmill  Cardiac Imaging Performed: non   Protocol: standard Bruce - maximal  Max BP:  212/69  Max MPHR (bpm):  193 85% MPR (bpm):  164  MPHR obtained (bpm):  200 % MPHR obtained:  103  Reached 85% MPHR (min:sec):  9:27 Total Exercise Time (min-sec):  13:00  Workload in METS:  15.3 Borg Scale: 16  Reason ETT Terminated:  desired heart rate attained    ST Segment Analysis At Rest: normal ST segments - no evidence of significant ST depression With Exercise: no evidence of significant ST depression  Other Information Arrhythmia:  No Angina during ETT:  absent (0) Quality of ETT:  diagnostic  ETT Interpretation:  normal - no evidence of ischemia by ST analysis  Comments: Excellent exercise capacity. No chest pain. Normal BP response to exercise (BP 137/82 >>> 212/69 at peak exercise). In recovery, BP did remain elevated for several minutes - 182/61 at DC (5 minutes). No ST-T changes to suggest ischemia.   Recommendations: FU with Dr. Tobias Alexander as directed. Signed,  Tereso Newcomer, PA-C   02/15/2014 9:49 AM

## 2014-02-16 ENCOUNTER — Telehealth: Payer: Self-pay | Admitting: *Deleted

## 2014-02-16 MED ORDER — AMLODIPINE BESYLATE 2.5 MG PO TABS: ORAL_TABLET | ORAL | Status: DC

## 2014-02-16 NOTE — Telephone Encounter (Signed)
LMTCB

## 2014-02-16 NOTE — Telephone Encounter (Signed)
Message copied by Loa Socks on Thu Feb 16, 2014  1:29 PM ------      Message from: Lars Masson      Created: Thu Feb 16, 2014 12:20 PM       Please tell him that his exercise capacity is great! He is aredy to run his half marathon, however his BP at the peak exercsie was elevated at 212 and stayed in 180 for a prolonged time.      I would increase his amlodipine to 5 mg if he can tolerate it.      KN                  ----- Message -----         From: Beatrice Lecher, PA-C         Sent: 02/15/2014   9:53 AM           To: Lars Masson, MD                   ------

## 2014-02-16 NOTE — Telephone Encounter (Signed)
Contacted pt to inform him that per Dr Delton See his exercise capacity was great, however his BP at the peak exercise was elevated at 212 and stayed at 180 for a prolonged time. Per Dr Delton See the pt should increase his amlodipine to 7.5 mg po daily.  Instructed pt that its prescribed in 2.5 mg- so he should take three times daily =7.5mg  Pt verbalized understanding and agrees with the plan.

## 2014-02-17 ENCOUNTER — Ambulatory Visit (INDEPENDENT_AMBULATORY_CARE_PROVIDER_SITE_OTHER): Payer: 59 | Admitting: Cardiology

## 2014-02-17 ENCOUNTER — Encounter: Payer: Self-pay | Admitting: Cardiology

## 2014-02-17 VITALS — BP 132/82 | HR 82 | Ht 70.0 in | Wt 174.0 lb

## 2014-02-17 DIAGNOSIS — I10 Essential (primary) hypertension: Secondary | ICD-10-CM

## 2014-02-17 DIAGNOSIS — R079 Chest pain, unspecified: Secondary | ICD-10-CM

## 2014-02-17 DIAGNOSIS — M940 Chondrocostal junction syndrome [Tietze]: Secondary | ICD-10-CM

## 2014-02-17 NOTE — Patient Instructions (Signed)
Your physician recommends that you continue on your current medications as directed. Please refer to the Current Medication list given to you today.  Your physician wants you to follow-up in: 6 months. You will receive a reminder letter in the mail two months in advance. If you don't receive a letter, please call our office to schedule the follow-up appointment.  

## 2014-02-17 NOTE — Progress Notes (Signed)
Patient ID: Cody Fisher, male   DOB: 05-17-1986, 28 y.o.   MRN: [id number]     Patient Name: Cody Fisher Date of Encounter: 02/17/2014  Primary Care Provider:  Cain Saupe, MD Primary Cardiologist:  Lars Masson  Patient Profile  A pleasant 28 year old who is coming for chest pain. The pain started 3 years ago when he went to the ER. He was sent home with Ibuprofen for presumed pericarditis that gave him some relief. He doesn't recall any viral infection or fevers prior to this episode. He has been having the same pains again and they come in cluster of weeks. They are on and off every few minute, sharp precordial, or left sided, non-radiating, not associated with SOB or palpitations. They only last few seconds, and are worse at night.   The patient is coming after 2 months, he has been exercising intensively running 5K daily, going to gym and running long runs on the weekends. He has no chest pain while running, but musculoskeletal type of pain when not active. He was also diagnosed with GERD and started on Prilosec. Overall he feels significantly better "relaxed" since he has been on amlodipine.  Problem List   Past Medical History  Diagnosis Date  . Chest pain   . Hypertension     Borderline  . Heart murmur     As a Child  . Essential hypertension 06/03/2013   No past surgical history on file.  Allergies  No Known Allergies  HPI  A pleasant 27 year old who is coming for chest pain. The pain started 3 years ago when he went to the ER. He was sent home with Ibuprofen for presumed pericarditis that gave him some relief. He doesn't recall any viral infection or fevers prior to this episode. He has been having the same pains again and they come in cluster of weeks. They are on and off every few minute, sharp precordial, or left sided, non-radiating, not associated with SOB or palpitations. They only last few seconds, and are worse at night.   Follow up after 6 weeks, no  more episodes of chest pain. The patient started strenous exercise - daily alternating cardio and weight lifting. In addition he bikes daily.  The patient is coming after one months he states that he feels great. He started an intense exercise program where every other day he exercises for about an hour, he alternates resistance training for about half an hour with 5K running for half an hour. On weekends he runs 10 K. The patient inquiries about further exercise training he would like to run a half Marathon and potentially full Marathon later this year. He wants more information with regards to his heart disease and high blood pressure.  12/15/2013 - resolution of chest pain since PPI started but only has been taking it for 5 days. He continues to exercise on a regular basis and is asymptomatic.   Home Medications  Prior to Admission medications   Medication Sig Start Date End Date Taking? Authorizing Provider  acetaminophen (TYLENOL) 325 MG tablet Take 650 mg by mouth as needed for pain.   Yes Historical Provider, MD  aspirin 325 MG tablet Take 325 mg by mouth as needed for pain.   Yes Historical Provider, MD  Calcium Carbonate Antacid (TUMS ULTRA PO) Take by mouth as needed.   Yes Historical Provider, MD  dexlansoprazole (DEXILANT) 60 MG capsule Take 60 mg by mouth daily.   Yes Historical Provider, MD  Family History  Family History  Problem Relation Age of Onset  . Diabetes      family history  . Hypertension Mother   . Diabetes Father     Social History  History   Social History  . Marital Status: Single    Spouse Name: N/A    Number of Children: N/A  . Years of Education: N/A   Occupational History  . Not on file.   Social History Main Topics  . Smoking status: Former Games developer  . Smokeless tobacco: Never Used     Comment: Quit 2014  . Alcohol Use: No  . Drug Use: No  . Sexual Activity: Not on file   Other Topics Concern  . Not on file   Social History Narrative    . No narrative on file     Review of Systems General:  No chills, fever, night sweats or weight changes.  Cardiovascular:  + chest pain, dyspnea on exertion, edema, orthopnea, + palpitations, paroxysmal nocturnal dyspnea. Dermatological: No rash, lesions/masses Respiratory: No cough, dyspnea Urologic: No hematuria, dysuria Abdominal:   No nausea, vomiting, diarrhea, bright red blood per rectum, melena, or hematemesis Neurologic:  No visual changes, wkns, changes in mental status. All other systems reviewed and are otherwise negative except as noted above.  Physical Exam  BP 145/85, 81 BPM General: Pleasant, NAD Psych: Normal affect. Neuro: Alert and oriented X 3. Moves all extremities spontaneously. HEENT: Normal  Neck: Supple without bruits or JVD. Lungs:  Resp regular and unlabored, CTA. Heart: RRR no s3, s4, or murmurs. Abdomen: Soft, non-tender, non-distended, BS + x 4.  Extremities: No clubbing, cyanosis or edema. DP/PT/Radials 2+ and equal bilaterally.  Accessory Clinical Findings  ECG - SR, 72 BPM, early repolarization in lateral leads  TTE 03/22/2013 ------------------------------------------------------------ Left ventricle: The cavity size was normal. Wall thickness was normal. Systolic function was normal. The estimated ejection fraction was in the range of 60% to 65%. Wall motion was normal; there were no regional wall motion abnormalities. The transmitral flow pattern was normal. The deceleration time of the early transmitral flow velocity was normal. The pulmonary vein flow pattern was normal. The tissue Doppler parameters were normal. Left ventricular diastolic function parameters were normal. ------------------------------------------------------------ Aortic valve: Structurally normal valve. Cusp separation was normal. Doppler: Transvalvular velocity was within the normal range. There was no stenosis. No  significant regurgitation. ------------------------------------------------------------ Aorta: The aorta was normal, not dilated, and non-diseased. ------------------------------------------------------------ Mitral valve: Structurally normal valve. Leaflet separation was normal. Doppler: Transvalvular velocity was within the normal range. There was no evidence for stenosis. No significant regurgitation. Peak gradient: 3mm Hg (D). ------------------------------------------------------------ Left atrium: The atrium was normal in size. ------------------------------------------------------------ Atrial septum: No defect or patent foramen ovale was identified. ------------------------------------------------------------ Right ventricle: The cavity size was normal. Systolic function was normal. ------------------------------------------------------------ Pulmonic valve: Structurally normal valve. Cusp separation was normal. Doppler: Transvalvular velocity was within the normal range. No significant regurgitation. ------------------------------------------------------------ Tricuspid valve: Structurally normal valve. Leaflet separation was normal. Doppler: Transvalvular velocity was within the normal range. Trivial regurgitation.  ------------------------------------------------------------ Pulmonary artery: Systolic pressure was within the normal range. ------------------------------------------------------------ Right atrium: The atrium was normal in size. ------------------------------------------------------------ Pericardium: There was no pericardial effusion. ------------------------------------------------------------ Systemic veins: Inferior vena cava: The vessel was normal in size; the respirophasic diameter changes were in the normal range (= 50%); findings are consistent with normal central  venous pressure. ------------------------------------------------------------ Post procedure conclusions Ascending Aorta: - The aorta was normal, not dilated, and non-diseased.  Exercise treadmill stress test: 05/17/2013 ETT Interpretation:  normal - no evidence of ischemia by ST analysis  Comments: Excellent exercise capacity. No chest pain. Normal BP response to exercise.  Baseline BP 140/87 => 204/63 (at peak exercise). No ST changes to suggest ischemia.   Recommendations: F/u with Dr. Tobias Alexander as directed. Signed,  Tereso Newcomer, PA-C   05/17/2013 3:36 PM      Assessment & Plan  28 year old male  1. Hypertension - elevated on 3 separate doctors visits including today. The patient arrived on a bike, however the BO checked 20 minutes after his arrival and still elevated at 168/48. He was counseled on proper diet and exercise at the last visit. In fact, the patient rarely eats out and prepares healthy food on his own. He also exercises on a regular basis.  We started the patient on amlodipine 2.5 mg po daily and he underwent an exercise treadmill test 1 month later that showed baseline BP 140/87 => 204/63 (at peak exercise).   BP controlled with amlodipine, improvement of symptoms.   BP diary shows 120-140/80-90 range.   I had a lengthy discussion with the patient in regards to proper training and preparing for half Marathon. Since the patient had normal echocardiogram with no LVH, he is encouraged to further running and exercising in general. Before he runs his half marathon we might consider performing another treadmill test to see how his blood pressure responses to high level of exercise. I expected his blood pressure will further improved with her regular exercise. The patient will contact us in about 6 months before he runs his first long phrase.  2. Atypical chest pain - the character is concerning for possible pericarditis vs GERD. Now resolved. We will continue  PPIs. No pericardial effusion on TTE.Marland Kitchen Elevated glucose - on 1 occasion, the patient is concerned as many family members with diabetes. HbA1c normal. No ischemia on exercise treadmill stress test.  The patient underwent GXT yesterday with excellent exercise capacity, but HTN response to stress at peak exercise (BP 137/82 >>> 212/69 at peak exercise).  He states that normally he doesn't push himself as much.  I would continue the same dose of amlodipine. Follow up in 6 months. Encouraged to continue exercising on a regular basis.    Lars Masson, MD 02/17/2014, 10:09 AM

## 2014-05-07 ENCOUNTER — Emergency Department (HOSPITAL_COMMUNITY)
Admission: EM | Admit: 2014-05-07 | Discharge: 2014-05-08 | Disposition: A | Discharge status: Home / Self Care | Payer: 59 | Attending: Emergency Medicine | Admitting: Emergency Medicine

## 2014-05-07 DIAGNOSIS — I10 Essential (primary) hypertension: Secondary | ICD-10-CM | POA: Diagnosis not present

## 2014-05-07 DIAGNOSIS — R0789 Other chest pain: Secondary | ICD-10-CM | POA: Diagnosis not present

## 2014-05-07 DIAGNOSIS — Z720 Tobacco use: Secondary | ICD-10-CM | POA: Insufficient documentation

## 2014-05-07 DIAGNOSIS — R112 Nausea with vomiting, unspecified: Secondary | ICD-10-CM | POA: Insufficient documentation

## 2014-05-07 DIAGNOSIS — R011 Cardiac murmur, unspecified: Secondary | ICD-10-CM | POA: Insufficient documentation

## 2014-05-07 DIAGNOSIS — Z79899 Other long term (current) drug therapy: Secondary | ICD-10-CM | POA: Diagnosis not present

## 2014-05-07 DIAGNOSIS — Z7982 Long term (current) use of aspirin: Secondary | ICD-10-CM | POA: Insufficient documentation

## 2014-05-08 ENCOUNTER — Encounter (HOSPITAL_COMMUNITY): Payer: Self-pay | Admitting: Emergency Medicine

## 2014-05-08 LAB — CBC WITH DIFFERENTIAL/PLATELET
BASOS ABS: 0 10*3/uL (ref 0.0–0.1)
BASOS PCT: 0 % (ref 0–1)
Eosinophils Absolute: 0 10*3/uL (ref 0.0–0.7)
Eosinophils Relative: 0 % (ref 0–5)
HEMATOCRIT: 47.1 % (ref 39.0–52.0)
Hemoglobin: 16.3 g/dL (ref 13.0–17.0)
LYMPHS PCT: 3 % — AB (ref 12–46)
Lymphs Abs: 0.5 10*3/uL — ABNORMAL LOW (ref 0.7–4.0)
MCH: 28.2 pg (ref 26.0–34.0)
MCHC: 34.6 g/dL (ref 30.0–36.0)
MCV: 81.6 fL (ref 78.0–100.0)
Monocytes Absolute: 0.8 10*3/uL (ref 0.1–1.0)
Monocytes Relative: 5 % (ref 3–12)
NEUTROS ABS: 14.2 10*3/uL — AB (ref 1.7–7.7)
NEUTROS PCT: 92 % — AB (ref 43–77)
PLATELETS: 265 10*3/uL (ref 150–400)
RBC: 5.77 MIL/uL (ref 4.22–5.81)
RDW: 13 % (ref 11.5–15.5)
WBC: 15.5 10*3/uL — AB (ref 4.0–10.5)

## 2014-05-08 LAB — COMPREHENSIVE METABOLIC PANEL
ALBUMIN: 4.9 g/dL (ref 3.5–5.2)
ALT: 93 U/L — AB (ref 0–53)
AST: 42 U/L — AB (ref 0–37)
Alkaline Phosphatase: 87 U/L (ref 39–117)
Anion gap: 18 — ABNORMAL HIGH (ref 5–15)
BILIRUBIN TOTAL: 0.8 mg/dL (ref 0.3–1.2)
BUN: 20 mg/dL (ref 6–23)
CHLORIDE: 99 meq/L (ref 96–112)
CO2: 23 meq/L (ref 19–32)
Calcium: 10.3 mg/dL (ref 8.4–10.5)
Creatinine, Ser: 1.02 mg/dL (ref 0.50–1.35)
GFR calc Af Amer: >90 mL/min (ref >90)
Glucose, Bld: 147 mg/dL — ABNORMAL HIGH (ref 70–99)
POTASSIUM: 4.6 meq/L (ref 3.7–5.3)
SODIUM: 140 meq/L (ref 137–147)
Total Protein: 8.2 g/dL (ref 6.0–8.3)

## 2014-05-08 MED ORDER — ONDANSETRON HCL 4 MG/2ML IJ SOLN
4.0000 mg | Freq: Once | INTRAMUSCULAR | Status: AC
  Administered 2014-05-08: 4 mg via INTRAVENOUS
  Filled 2014-05-08: qty 2

## 2014-05-08 MED ORDER — SODIUM CHLORIDE 0.9 % IV BOLUS (SEPSIS)
2000.0000 mL | Freq: Once | INTRAVENOUS | Status: AC
  Administered 2014-05-08: 2000 mL via INTRAVENOUS

## 2014-05-08 MED ORDER — ONDANSETRON 4 MG PO TBDP: ORAL_TABLET | ORAL | Status: DC

## 2014-05-08 NOTE — Discharge Instructions (Signed)
Chest Pain (Nonspecific) °It is often hard to give a diagnosis for the cause of chest pain. There is always a chance that your pain could be related to something serious, such as a heart attack or a blood clot in the lungs. You need to follow up with your doctor. °HOME CARE °· If antibiotic medicine was given, take it as directed by your doctor. Finish the medicine even if you start to feel better. °· For the next few days, avoid activities that bring on chest pain. Continue physical activities as told by your doctor. °· Do not use any tobacco products. This includes cigarettes, chewing tobacco, and e-cigarettes. °· Avoid drinking alcohol. °· Only take medicine as told by your doctor. °· Follow your doctor's suggestions for more testing if your chest pain does not go away. °· Keep all doctor visits you made. °GET HELP IF: °· Your chest pain does not go away, even after treatment. °· You have a rash with blisters on your chest. °· You have a fever. °GET HELP RIGHT AWAY IF:  °· You have more pain or pain that spreads to your arm, neck, jaw, back, or belly (abdomen). °· You have shortness of breath. °· You cough more than usual or cough up blood. °· You have very bad back or belly pain. °· You feel sick to your stomach (nauseous) or throw up (vomit). °· You have very bad weakness. °· You pass out (faint). °· You have chills. °This is an emergency. Do not wait to see if the problems will go away. Call your local emergency services (911 in [place] Do not drive yourself to the hospital. °MAKE SURE YOU:  °· Understand these instructions. °· Will watch your condition. °· Will get help right away if you are not doing well or get worse. °Document Released: 11/19/2007 Document Revised: 06/07/2013 Document Reviewed: 11/19/2007 °ExitCare® Patient Information ©2015 [employer]. This information is not intended to replace advice given to you by your health care provider. Make sure you discuss any questions you have with your  health care provider. ° °Nausea and Vomiting °Nausea is a sick feeling that often comes before throwing up (vomiting). Vomiting is a reflex where stomach contents come out of your mouth. Vomiting can cause severe loss of body fluids (dehydration). Children and elderly adults can become dehydrated quickly, especially if they also have diarrhea. Nausea and vomiting are symptoms of a condition or disease. It is important to find the cause of your symptoms. °CAUSES  °· Direct irritation of the stomach lining. This irritation can result from increased acid production (gastroesophageal reflux disease), infection, food poisoning, taking certain medicines (such as nonsteroidal anti-inflammatory drugs), alcohol use, or tobacco use. °· Signals from the brain. These signals could be caused by a headache, heat exposure, an inner ear disturbance, increased pressure in the brain from injury, infection, a tumor, or a concussion, pain, emotional stimulus, or metabolic problems. °· An obstruction in the gastrointestinal tract (bowel obstruction). °· Illnesses such as diabetes, hepatitis, gallbladder problems, appendicitis, kidney problems, cancer, sepsis, atypical symptoms of a heart attack, or eating disorders. °· Medical treatments such as chemotherapy and radiation. °· Receiving medicine that makes you sleep (general anesthetic) during surgery. °DIAGNOSIS °Your caregiver may ask for tests to be done if the problems do not improve after a few days. Tests may also be done if symptoms are severe or if the reason for the nausea and vomiting is not clear. Tests may include: °· Urine tests. °· Blood tests. °·   Stool tests. °· Cultures (to look for evidence of infection). °· X-rays or other imaging studies. °Test results can help your caregiver make decisions about treatment or the need for additional tests. °TREATMENT °You need to stay well hydrated. Drink frequently but in small amounts. You may wish to drink water, sports drinks,  clear broth, or eat frozen ice pops or gelatin dessert to help stay hydrated. When you eat, eating slowly may help prevent nausea. There are also some antinausea medicines that may help prevent nausea. °HOME CARE INSTRUCTIONS  °· Take all medicine as directed by your caregiver. °· If you do not have an appetite, do not force yourself to eat. However, you must continue to drink fluids. °· If you have an appetite, eat a normal diet unless your caregiver tells you differently. °¨ Eat a variety of complex carbohydrates (rice, wheat, potatoes, bread), lean meats, yogurt, fruits, and vegetables. °¨ Avoid high-fat foods because they are more difficult to digest. °· Drink enough water and fluids to keep your urine clear or pale yellow. °· If you are dehydrated, ask your caregiver for specific rehydration instructions. Signs of dehydration may include: °¨ Severe thirst. °¨ Dry lips and mouth. °¨ Dizziness. °¨ Dark urine. °¨ Decreasing urine frequency and amount. °¨ Confusion. °¨ Rapid breathing or pulse. °SEEK IMMEDIATE MEDICAL CARE IF:  °· You have blood or brown flecks (like coffee grounds) in your vomit. °· You have black or bloody stools. °· You have a severe headache or stiff neck. °· You are confused. °· You have severe abdominal pain. °· You have chest pain or trouble breathing. °· You do not urinate at least once every 8 hours. °· You develop cold or clammy skin. °· You continue to vomit for longer than 24 to 48 hours. °· You have a fever. °MAKE SURE YOU:  °· Understand these instructions. °· Will watch your condition. °· Will get help right away if you are not doing well or get worse. °Document Released: 06/02/2005 Document Revised: 08/25/2011 Document Reviewed: 10/30/2010 °ExitCare® Patient Information ©2015 [employer]. This information is not intended to replace advice given to you by your health care provider. Make sure you discuss any questions you have with your health care provider. ° °

## 2014-05-08 NOTE — ED Notes (Signed)
Resting quietly with eye closed. Easily arousable. Verbally responsive. Resp even and unlabored. ABC's intact. No N/V noted.  Family at bedside. NAD noted.

## 2014-05-08 NOTE — ED Provider Notes (Signed)
CSN: [id number]     Arrival date & time 05/07/14  2350 History   First MD Initiated Contact with Patient 05/08/14 0028     Chief Complaint  Patient presents with  . Emesis  . Chest Pain     (Consider location/radiation/quality/duration/timing/severity/associated sxs/prior Treatment) HPI Patient presents with 8 episodes of vomiting starting around 6:30 this evening. Denies fever or chills. Denies diarrhea. Last bowel movement was this morning and normal. No sick contacts. Mild diffuse body aches. Patient currently denies any abdominal pain. Denies any urinary symptoms. No blood in vomit. Past Medical History  Diagnosis Date  . Chest pain   . Hypertension     Borderline  . Heart murmur     As a Child  . Essential hypertension 06/03/2013   History reviewed. No pertinent past surgical history. Family History  Problem Relation Age of Onset  . Diabetes      family history  . Hypertension Mother   . Diabetes Father    History  Substance Use Topics  . Smoking status: Current Some Day Smoker  . Smokeless tobacco: Never Used     Comment: Quit 2014  . Alcohol Use: Yes     Comment: occ    Review of Systems  Constitutional: Negative for fever and chills.  Respiratory: Negative for cough and shortness of breath.   Cardiovascular: Negative for chest pain.  Gastrointestinal: Positive for nausea and vomiting. Negative for abdominal pain, diarrhea, constipation and blood in stool.  Genitourinary: Negative for dysuria and flank pain.  Musculoskeletal: Negative for back pain, neck pain and neck stiffness.  Skin: Negative for rash and wound.  Neurological: Negative for dizziness, weakness, light-headedness, numbness and headaches.  All other systems reviewed and are negative.     Allergies  Review of patient's allergies indicates no known allergies.  Home Medications   Prior to Admission medications   Medication Sig Start Date End Date Taking? Authorizing Provider   acetaminophen (TYLENOL) 325 MG tablet Take 650 mg by mouth as needed for pain.   Yes Historical Provider, MD  amLODipine (NORVASC) 5 MG tablet Take 5 mg by mouth daily.   Yes Historical Provider, MD  Multiple Vitamin (MULTIVITAMIN) capsule Take 1 capsule by mouth daily.   Yes Historical Provider, MD  Omega-3 Fatty Acids (FISH OIL PO) Take by mouth.   Yes Historical Provider, MD  omeprazole (PRILOSEC) 40 MG capsule Take 40 mg by mouth daily.   Yes Historical Provider, MD  amLODipine (NORVASC) 2.5 MG tablet Take 1 tab 3 times daily 02/16/14   Lars MassonKatarina H Nelson, MD  aspirin 325 MG tablet Take 325 mg by mouth as needed for pain.    Historical Provider, MD  Calcium Carbonate Antacid (TUMS ULTRA PO) Take by mouth as needed.    Historical Provider, MD  GARLIC PO Take by mouth.    Historical Provider, MD  ondansetron (ZOFRAN ODT) 4 MG disintegrating tablet 4mg  ODT q4 hours prn nausea/vomit 05/08/14   Loren Raceravid Capone Schwinn, MD   BP 118/65 mmHg  Pulse 104  Temp(Src) 98.2 F (36.8 C) (Oral)  Resp 18  SpO2 98% Physical Exam  Constitutional: He is oriented to person, place, and time. He appears well-developed and well-nourished. No distress.  HENT:  Head: Normocephalic and atraumatic.  Mouth/Throat: Oropharynx is clear and moist.  Eyes: EOM are normal. Pupils are equal, round, and reactive to light.  Neck: Normal range of motion. Neck supple.  Cardiovascular: Normal rate and regular rhythm.   Pulmonary/Chest: Effort normal and  breath sounds normal. No respiratory distress. He has no wheezes. He has no rales. He exhibits no tenderness.  Abdominal: Soft. Bowel sounds are normal. He exhibits no distension and no mass. There is no tenderness. There is no rebound and no guarding.  Musculoskeletal: Normal range of motion. He exhibits no edema or tenderness.  Neurological: He is alert and oriented to person, place, and time.  Skin: Skin is warm and dry. No rash noted. No erythema.  Psychiatric: He has a normal  mood and affect. His behavior is normal.  Nursing note and vitals reviewed.   ED Course  Procedures (including critical care time) Labs Review Labs Reviewed  CBC WITH DIFFERENTIAL - Abnormal; Notable for the following:    WBC 15.5 (*)    Neutrophils Relative % 92 (*)    Neutro Abs 14.2 (*)    Lymphocytes Relative 3 (*)    Lymphs Abs 0.5 (*)    All other components within normal limits  COMPREHENSIVE METABOLIC PANEL - Abnormal; Notable for the following:    Glucose, Bld 147 (*)    AST 42 (*)    ALT 93 (*)    Anion gap 18 (*)    All other components within normal limits    Imaging Review No results found.   EKG Interpretation None      Date: 05/08/2014  Rate: 106  Rhythm: sinus tachycardia  QRS Axis: normal  Intervals: normal  ST/T Wave abnormalities: nonspecific T wave changes  Conduction Disutrbances:none  Narrative Interpretation:   Old EKG Reviewed: none available   MDM   Final diagnoses:  Non-intractable vomiting with nausea, vomiting of unspecified type  Atypical chest pain    Patient states he is doing much better. No further vomiting in the emergency department. Abdomen is soft and nontender. Will PO trial and anticipate discharge home.  Patient with mild burning chest pain while in the emergency department. Likely due to vomiting. EKG without any acute abnormalities. Patient has no risk factors for coronary artery disease. Currently the patient has no chest pain or abdominal pain.  Loren Raceravid Joe Gee, MD 05/08/14 [phone]

## 2014-05-08 NOTE — ED Notes (Signed)
Awake. Verbally responsive. Resp even and unlabored. ABC's intact. NAD noted. 

## 2014-05-08 NOTE — ED Notes (Signed)
Pt present with c/o body aches, chills, n/v onset 1830 tonight. Pt reports 6-7 episodes of emesis, denies diarrhea.

## 2014-07-06 ENCOUNTER — Encounter: Payer: Self-pay | Admitting: Cardiology

## 2014-07-19 ENCOUNTER — Encounter: Payer: Self-pay | Admitting: *Deleted

## 2014-08-23 ENCOUNTER — Ambulatory Visit: Payer: Self-pay | Admitting: Cardiology

## 2014-09-25 ENCOUNTER — Ambulatory Visit: Payer: Self-pay | Admitting: Cardiology

## 2014-09-29 ENCOUNTER — Encounter: Payer: Self-pay | Admitting: Cardiology

## 2014-12-02 ENCOUNTER — Other Ambulatory Visit: Payer: Self-pay | Admitting: Cardiology

## 2015-01-24 ENCOUNTER — Emergency Department (HOSPITAL_COMMUNITY): Payer: 59

## 2015-01-24 ENCOUNTER — Emergency Department (HOSPITAL_COMMUNITY)
Admission: EM | Admit: 2015-01-24 | Discharge: 2015-01-24 | Disposition: A | Discharge status: Home / Self Care | Payer: 59 | Attending: Emergency Medicine | Admitting: Emergency Medicine

## 2015-01-24 ENCOUNTER — Encounter (HOSPITAL_COMMUNITY): Payer: Self-pay | Admitting: Emergency Medicine

## 2015-01-24 DIAGNOSIS — Z79899 Other long term (current) drug therapy: Secondary | ICD-10-CM | POA: Diagnosis not present

## 2015-01-24 DIAGNOSIS — R079 Chest pain, unspecified: Secondary | ICD-10-CM | POA: Insufficient documentation

## 2015-01-24 DIAGNOSIS — Z720 Tobacco use: Secondary | ICD-10-CM | POA: Diagnosis not present

## 2015-01-24 DIAGNOSIS — I10 Essential (primary) hypertension: Secondary | ICD-10-CM | POA: Diagnosis not present

## 2015-01-24 DIAGNOSIS — R011 Cardiac murmur, unspecified: Secondary | ICD-10-CM | POA: Diagnosis not present

## 2015-01-24 DIAGNOSIS — R0602 Shortness of breath: Secondary | ICD-10-CM | POA: Diagnosis not present

## 2015-01-24 DIAGNOSIS — Z7982 Long term (current) use of aspirin: Secondary | ICD-10-CM | POA: Diagnosis not present

## 2015-01-24 LAB — CBC
HEMATOCRIT: 45.9 % (ref 39.0–52.0)
Hemoglobin: 15.4 g/dL (ref 13.0–17.0)
MCH: 27.4 pg (ref 26.0–34.0)
MCHC: 33.6 g/dL (ref 30.0–36.0)
MCV: 81.7 fL (ref 78.0–100.0)
Platelets: 254 10*3/uL (ref 150–400)
RBC: 5.62 MIL/uL (ref 4.22–5.81)
RDW: 12.9 % (ref 11.5–15.5)
WBC: 7.4 10*3/uL (ref 4.0–10.5)

## 2015-01-24 LAB — LIPASE, BLOOD: Lipase: 15 U/L — ABNORMAL LOW (ref 22–51)

## 2015-01-24 LAB — BASIC METABOLIC PANEL
ANION GAP: 12 (ref 5–15)
BUN: 11 mg/dL (ref 6–20)
CALCIUM: 10.3 mg/dL (ref 8.9–10.3)
CO2: 26 mmol/L (ref 22–32)
CREATININE: 0.95 mg/dL (ref 0.61–1.24)
Chloride: 103 mmol/L (ref 101–111)
GFR calc Af Amer: >60 mL/min (ref >60)
GFR calc non Af Amer: >60 mL/min (ref >60)
Glucose, Bld: 95 mg/dL (ref 65–99)
Potassium: 3.8 mmol/L (ref 3.5–5.1)
Sodium: 141 mmol/L (ref 135–145)

## 2015-01-24 LAB — HEPATIC FUNCTION PANEL
ALBUMIN: 5.4 g/dL — AB (ref 3.5–5.0)
ALK PHOS: 73 U/L (ref 38–126)
ALT: 43 U/L (ref 17–63)
AST: 29 U/L (ref 15–41)
BILIRUBIN TOTAL: 0.8 mg/dL (ref 0.3–1.2)
TOTAL PROTEIN: 8.5 g/dL — AB (ref 6.5–8.1)

## 2015-01-24 LAB — D-DIMER, QUANTITATIVE (NOT AT ARMC)

## 2015-01-24 LAB — I-STAT TROPONIN, ED: Troponin i, poc: 0.01 ng/mL (ref 0.00–0.08)

## 2015-01-24 MED ORDER — SUCRALFATE 1 GM/10ML PO SUSP: 1.0000 g | Freq: Three times a day (TID) | ORAL | Status: DC

## 2015-01-24 NOTE — ED Notes (Signed)
EKG shown to Mesnir MD

## 2015-01-24 NOTE — ED Notes (Signed)
Pt states he woke up with intermittent chest pain. Coming and going about every 20 minutes a large pounding pain. States it makes him feel SOB when it happens. Denies N/V/D, fever/chills, denies any current CP or SOB.

## 2015-01-24 NOTE — ED Provider Notes (Signed)
CSN: [id number]     Arrival date & time 01/24/15  1724 History   First MD Initiated Contact with Patient 01/24/15 2033     Chief Complaint  Patient presents with  . Chest Pain    (Consider location/radiation/quality/duration/timing/severity/associated sxs/prior Treatment) HPI Comments: 29 year old male with a history of chest pain, hypertension, and heart murmur as a child presents to the emergency department today for evaluation of chest pain. Patient states that he has had similar chest pain intermittently over the past 5 years. He has seen a cardiologist for this as well as a gastroenterologist. He has had a stress test as well as an echo completed, both of which were unremarkable. Patient reports having a barium swallow study completed which showed likely reflux. He has been placed on omeprazole and has been taking this ever since.  Patient states that his pain has been intermittent today and has been worse than normal, causing him concern. He describes the pain as a deep throbbing pain which lasts between 1 and 5 seconds before resolving. He will have recurrence of his symptoms approximately every 20 minutes. He states that he has difficulty with the pain on some days more than others. He denies any modifying factors of his symptoms and does not know what aggravates his pain. He denies tobacco abuse. No history of sudden cardiac death. No recent travel, surgeries, or hospitalizations. No associated syncope or leg swelling. Patient has no pain at this time.  Cardiologist - Dr. Tobias Alexander  Patient is a 29 y.o. male presenting with chest pain. The history is provided by the patient. No language interpreter was used.  Chest Pain Associated symptoms: shortness of breath   Associated symptoms: no diaphoresis, no fever, no nausea and not vomiting     Past Medical History  Diagnosis Date  . Chest pain   . Hypertension     Borderline  . Heart murmur     As a Child  . Essential  hypertension 06/03/2013   History reviewed. No pertinent past surgical history. Family History  Problem Relation Age of Onset  . Diabetes      family history  . Hypertension Mother   . Diabetes Father    Social History  Substance Use Topics  . Smoking status: Current Some Day Smoker  . Smokeless tobacco: Never Used     Comment: Quit 2014  . Alcohol Use: Yes     Comment: occ    Review of Systems  Constitutional: Negative for fever, chills and diaphoresis.  Respiratory: Positive for shortness of breath.   Cardiovascular: Positive for chest pain. Negative for leg swelling.  Gastrointestinal: Negative for nausea and vomiting.  Neurological: Negative for syncope.  All other systems reviewed and are negative.   Allergies  Review of patient's allergies indicates no known allergies.  Home Medications   Prior to Admission medications   Medication Sig Start Date End Date Taking? Authorizing Provider  amLODipine (NORVASC) 2.5 MG tablet Take 1 tab 3 times daily 02/16/14  Yes Lars Masson, MD  amLODipine (NORVASC) 5 MG tablet TAKE 1 TABLET BY MOUTH EVERY DAY 12/04/14  Yes Lars Masson, MD  aspirin 325 MG tablet Take 325 mg by mouth as needed for pain.   Yes Historical Provider, MD  Calcium Carbonate Antacid (TUMS ULTRA PO) Take by mouth as needed.   Yes Historical Provider, MD  omeprazole (PRILOSEC) 40 MG capsule Take 40 mg by mouth daily.   Yes Historical Provider, MD  ondansetron (ZOFRAN ODT)  4 MG disintegrating tablet 4mg  ODT q4 hours prn nausea/vomit Patient not taking: Reported on 01/24/2015 05/08/14   Loren Racer, MD  sucralfate (CARAFATE) 1 GM/10ML suspension Take 10 mLs (1 g total) by mouth 4 (four) times daily -  with meals and at bedtime. 01/24/15   Antony Madura, PA-C   BP 142/79 mmHg  Pulse 74  Temp(Src) 98.5 F (36.9 C) (Oral)  Resp 15  SpO2 100%   Physical Exam  Constitutional: He is oriented to person, place, and time. He appears well-developed and  well-nourished. No distress.  Nontoxic/nonseptic appearing. In no distress.  HENT:  Head: Normocephalic and atraumatic.  Eyes: Conjunctivae and EOM are normal. No scleral icterus.  Neck: Normal range of motion.  No JVD  Cardiovascular: Normal rate, regular rhythm and intact distal pulses.   Pulmonary/Chest: Effort normal. No respiratory distress.  Respirations even and unlabored  Musculoskeletal: Normal range of motion.  No pitting edema  Neurological: He is alert and oriented to person, place, and time. He exhibits normal muscle tone. Coordination normal.  Skin: Skin is warm and dry. No rash noted. He is not diaphoretic. No erythema. No pallor.  Psychiatric: He has a normal mood and affect. His behavior is normal.  Nursing note and vitals reviewed.   ED Course  Procedures (including critical care time) Labs Review Labs Reviewed  LIPASE, BLOOD - Abnormal; Notable for the following:    Lipase 15 (*)    All other components within normal limits  HEPATIC FUNCTION PANEL - Abnormal; Notable for the following:    Total Protein 8.5 (*)    Albumin 5.4 (*)    Bilirubin, Direct <0.1 (*)    All other components within normal limits  BASIC METABOLIC PANEL  CBC  D-DIMER, QUANTITATIVE (NOT AT [facility])  Rosezena Sensor, ED    Imaging Review Dg Chest 2 View  01/24/2015   CLINICAL DATA:  Left-sided chest pain today. Occasional smoker with history of hypertension. Initial encounter.  EXAM: CHEST  2 VIEW  COMPARISON:  03/12/2010.  FINDINGS: The heart size and mediastinal contours are normal. The lungs are clear. There is no pleural effusion or pneumothorax. No acute osseous findings are identified.  IMPRESSION: Stable examination.  No active cardiopulmonary process.   Electronically Signed   By: Carey Bullocks M.D.   On: 01/24/2015 17:59    ED ECG REPORT   Date: 01/24/2015  Rate: 85  Rhythm: normal sinus rhythm  QRS Axis: normal  Intervals: normal  ST/T Wave abnormalities: normal   Conduction Disutrbances:RSR' in V1 or V2; right VCD or RVH  Narrative Interpretation: NSR; no STEMI  Old EKG Reviewed: none available to this provider  I have personally reviewed the EKG tracing and agree with the computerized printout as noted.   MDM   Final diagnoses:  Chest pain, unspecified chest pain type    29 year old male presents to the emergency department for evaluation of chest pain. He reports experiencing similar chest pain intermittently over the past 5 years. He has seen a cardiologist and gastroenterologist for further evaluation of this pain. He presented to the ED today because the pain was more consistent and worse then he usually experiences. Patient has a noncontributory cardiac workup today. Troponin is negative and EKG is nonischemic. Heart score is 0 consistent with low risk of acute coronary event. Patient is also at low risk for a pulmonary embolism. He has a negative d-dimer today. Doubt aortic dissection or other emergent cardiac etiology. No family history of sudden  cardiac death.  Patient does have a history of esophageal reflux. It is possible that his symptoms today are caused by an exacerbation of his reflux. Will at Carafate to his daily Prilosec regimen. I have advised follow-up with his primary care doctor as well as his gastroenterologist. Patient instructed to follow-up with his cardiologist as needed. Return precautions discussed and provided. Patient agreeable to plan with no unaddressed concerns. Patient discharged in good condition.   Filed Vitals:   01/24/15 1734 01/24/15 2122  BP: 160/86 142/79  Pulse: 81 74  Temp: 98.6 F (37 C) 98.5 F (36.9 C)  TempSrc: Oral Oral  Resp: 18 15  SpO2: 100% 100%     Antony Madura, PA-C 01/24/15 2137  Marily Memos, MD 01/25/15 1453

## 2015-01-24 NOTE — Discharge Instructions (Signed)

## 2015-04-03 ENCOUNTER — Other Ambulatory Visit: Payer: Self-pay | Admitting: Cardiology

## 2015-08-06 ENCOUNTER — Other Ambulatory Visit: Payer: Self-pay | Admitting: Cardiology

## 2015-08-13 ENCOUNTER — Encounter: Payer: Self-pay | Admitting: Cardiology

## 2015-08-13 ENCOUNTER — Ambulatory Visit (INDEPENDENT_AMBULATORY_CARE_PROVIDER_SITE_OTHER): Payer: 59 | Admitting: Cardiology

## 2015-08-13 VITALS — BP 124/78 | HR 76 | Ht 70.0 in | Wt 162.0 lb

## 2015-08-13 DIAGNOSIS — I10 Essential (primary) hypertension: Secondary | ICD-10-CM | POA: Diagnosis not present

## 2015-08-13 DIAGNOSIS — R072 Precordial pain: Secondary | ICD-10-CM

## 2015-08-13 MED ORDER — AMLODIPINE BESYLATE 5 MG PO TABS: 5.0000 mg | ORAL_TABLET | Freq: Every day | ORAL | Status: DC

## 2015-08-13 NOTE — Progress Notes (Signed)
Patient ID: Cody Fisher, male   DOB: March 04, 1986, 30 y.o.   MRN: [id number]    Patient Name: Cody Fisher Date of Encounter: 08/13/2015  Primary Care Provider:  Cain Saupe, MD Primary Cardiologist:  Lars Masson  Patient Profile  Hypertension, chest pain  Problem List   Past Medical History  Diagnosis Date  . Chest pain   . Hypertension     Borderline  . Heart murmur     As a Child  . Essential hypertension 06/03/2013   No past surgical history on file.  Allergies  No Known Allergies  HPI  A pleasant 30 year old who is coming for chest pain. The pain started 3 years ago when he went to the ER. He was sent home with Ibuprofen for presumed pericarditis that gave him some relief. He doesn't recall any viral infection or fevers prior to this episode. He has been having the same pains again and they come in cluster of weeks. They are on and off every few minute, sharp precordial, or left sided, non-radiating, not associated with SOB or palpitations. They only last few seconds, and are worse at night.   The patient is coming after almost 2 years, he has been exercising intensively running 5K daily, going to gym and running long runs and biking on the weekends.  He has been to gastroenterologist who sings that his problem might be related to is a facial spasm but that was not confirmed. His EGD was otherwise normal. He is taking PPIs. He states that his pains mostly acute or about 2 hours after eating food, but they can also happen at night and he believes they're related to stress and anxiety and is planning to visit up psychiatrist. He is compliant to amlodipine and denies any lower extremity edema.  Home Medications  Prior to Admission medications   Medication Sig Start Date End Date Taking? Authorizing Provider  acetaminophen (TYLENOL) 325 MG tablet Take 650 mg by mouth as needed for pain.   Yes Historical Provider, MD  aspirin 325 MG tablet Take 325 mg by mouth as  needed for pain.   Yes Historical Provider, MD  Calcium Carbonate Antacid (TUMS ULTRA PO) Take by mouth as needed.   Yes Historical Provider, MD  dexlansoprazole (DEXILANT) 60 MG capsule Take 60 mg by mouth daily.   Yes Historical Provider, MD   Family History  Family History  Problem Relation Age of Onset  . Diabetes      family history  . Hypertension Mother   . Diabetes Father     Social History  Social History   Social History  . Marital Status: Single    Spouse Name: N/A  . Number of Children: N/A  . Years of Education: N/A   Occupational History  . Not on file.   Social History Main Topics  . Smoking status: Current Some Day Smoker  . Smokeless tobacco: Never Used     Comment: Quit 2014  . Alcohol Use: Yes     Comment: occ  . Drug Use: No  . Sexual Activity: Not on file   Other Topics Concern  . Not on file   Social History Narrative    Review of Systems General:  No chills, fever, night sweats or weight changes.  Cardiovascular:  + chest pain, dyspnea on exertion, edema, orthopnea, + palpitations, paroxysmal nocturnal dyspnea. Dermatological: No rash, lesions/masses Respiratory: No cough, dyspnea Urologic: No hematuria, dysuria Abdominal:   No nausea, vomiting, diarrhea, bright  red blood per rectum, melena, or hematemesis Neurologic:  No visual changes, wkns, changes in mental status. All other systems reviewed and are otherwise negative except as noted above.  Physical Exam  BP 145/85, 81 BPM General: Pleasant, NAD Psych: Normal affect. Neuro: Alert and oriented X 3. Moves all extremities spontaneously. HEENT: Normal  Neck: Supple without bruits or JVD. Lungs:  Resp regular and unlabored, CTA. Heart: RRR no s3, s4, or murmurs. Abdomen: Soft, non-tender, non-distended, BS + x 4.  Extremities: No clubbing, cyanosis or edema. DP/PT/Radials 2+ and equal bilaterally.  Accessory Clinical Findings  ECG - SR, 72 BPM, early repolarization in lateral  leads  TTE 03/22/2013 ------------------------------------------------------------ Left ventricle: The cavity size was normal. Wall thickness was normal. Systolic function was normal. The estimated ejection fraction was in the range of 60% to 65%. Wall motion was normal; there were no regional wall motion abnormalities. The transmitral flow pattern was normal. The deceleration time of the early transmitral flow velocity was normal. The pulmonary vein flow pattern was normal. The tissue Doppler parameters were normal. Left ventricular diastolic function parameters were normal. ------------------------------------------------------------ Aortic valve: Structurally normal valve. Cusp separation was normal. Doppler: Transvalvular velocity was within the normal range. There was no stenosis. No significant regurgitation. ------------------------------------------------------------ Aorta: The aorta was normal, not dilated, and non-diseased. ------------------------------------------------------------ Mitral valve: Structurally normal valve. Leaflet separation was normal. Doppler: Transvalvular velocity was within the normal range. There was no evidence for stenosis. No significant regurgitation. Peak gradient: 3mm Hg (D). ------------------------------------------------------------ Left atrium: The atrium was normal in size. ------------------------------------------------------------ Atrial septum: No defect or patent foramen ovale was identified. ------------------------------------------------------------ Right ventricle: The cavity size was normal. Systolic function was normal. ------------------------------------------------------------ Pulmonic valve: Structurally normal valve. Cusp separation was normal. Doppler: Transvalvular velocity was within the normal range. No significant regurgitation. ------------------------------------------------------------ Tricuspid valve:  Structurally normal valve. Leaflet separation was normal. Doppler: Transvalvular velocity was within the normal range. Trivial regurgitation.  ------------------------------------------------------------ Pulmonary artery: Systolic pressure was within the normal range. ------------------------------------------------------------ Right atrium: The atrium was normal in size. ------------------------------------------------------------ Pericardium: There was no pericardial effusion. ------------------------------------------------------------ Systemic veins: Inferior vena cava: The vessel was normal in size; the respirophasic diameter changes were in the normal range (= 50%); findings are consistent with normal central venous pressure. ------------------------------------------------------------ Post procedure conclusions Ascending Aorta: - The aorta was normal, not dilated, and non-diseased.  Exercise treadmill stress test: 05/17/2013 ETT Interpretation:  normal - no evidence of ischemia by ST analysis  Comments: Excellent exercise capacity. No chest pain. Normal BP response to exercise.  Baseline BP 140/87 => 204/63 (at peak exercise). No ST changes to suggest ischemia.   Recommendations: F/u with Dr. Tobias Alexander as directed. Signed,  Tereso Newcomer, PA-C   05/17/2013 3:36 PM     Assessment & Plan  30 year old male  1. Hypertension - with hypertensive response during stress test, baseline BP 140/87 => 204/63 (at peak exercise). Started on amlodipine now well controlled. There is no LVH on his echocardiogram.  2. Atypical chest pain - GXT in September 2015 showed excellent exercise capacity, but HTN response to stress at peak exercise (BP 137/82 >>> 212/69 at peak exercise). His echocardiogram shows normal systolic and diastolic function  Follow-up in 2 years.   Lars Masson, MD 08/13/2015, 8:48 AM

## 2015-08-13 NOTE — Patient Instructions (Signed)
Medication Instructions:  Same  Labwork: None  Testing/Procedures: None  Follow-Up: Your physician wants you to follow-up in: 2 years. You will receive a reminder letter in the mail two months in advance. If you don't receive a letter, please call our office to schedule the follow-up appointment.      If you need a refill on your cardiac medications before your next appointment, please call your pharmacy.

## 2016-09-22 ENCOUNTER — Other Ambulatory Visit: Payer: Self-pay | Admitting: Cardiology

## 2016-10-15 ENCOUNTER — Other Ambulatory Visit: Payer: Self-pay | Admitting: Cardiology

## 2017-03-22 ENCOUNTER — Encounter (HOSPITAL_COMMUNITY): Payer: Self-pay

## 2017-03-22 ENCOUNTER — Observation Stay (HOSPITAL_COMMUNITY)
Admission: EM | Admit: 2017-03-22 | Discharge: 2017-03-24 | Disposition: A | Discharge status: Home / Self Care | Payer: 59 | Attending: Orthopaedic Surgery | Admitting: Orthopaedic Surgery

## 2017-03-22 ENCOUNTER — Emergency Department (HOSPITAL_COMMUNITY): Payer: 59

## 2017-03-22 DIAGNOSIS — X58XXXA Exposure to other specified factors, initial encounter: Secondary | ICD-10-CM | POA: Diagnosis not present

## 2017-03-22 DIAGNOSIS — Y998 Other external cause status: Secondary | ICD-10-CM | POA: Insufficient documentation

## 2017-03-22 DIAGNOSIS — Y92322 Soccer field as the place of occurrence of the external cause: Secondary | ICD-10-CM | POA: Insufficient documentation

## 2017-03-22 DIAGNOSIS — S82242A Displaced spiral fracture of shaft of left tibia, initial encounter for closed fracture: Secondary | ICD-10-CM | POA: Diagnosis not present

## 2017-03-22 DIAGNOSIS — K219 Gastro-esophageal reflux disease without esophagitis: Secondary | ICD-10-CM | POA: Insufficient documentation

## 2017-03-22 DIAGNOSIS — Z79899 Other long term (current) drug therapy: Secondary | ICD-10-CM | POA: Insufficient documentation

## 2017-03-22 DIAGNOSIS — S82202A Unspecified fracture of shaft of left tibia, initial encounter for closed fracture: Secondary | ICD-10-CM | POA: Diagnosis present

## 2017-03-22 DIAGNOSIS — I10 Essential (primary) hypertension: Secondary | ICD-10-CM | POA: Insufficient documentation

## 2017-03-22 DIAGNOSIS — F17200 Nicotine dependence, unspecified, uncomplicated: Secondary | ICD-10-CM | POA: Insufficient documentation

## 2017-03-22 DIAGNOSIS — S82492A Other fracture of shaft of left fibula, initial encounter for closed fracture: Secondary | ICD-10-CM | POA: Diagnosis not present

## 2017-03-22 DIAGNOSIS — W03XXXA Other fall on same level due to collision with another person, initial encounter: Secondary | ICD-10-CM | POA: Insufficient documentation

## 2017-03-22 DIAGNOSIS — S82252A Displaced comminuted fracture of shaft of left tibia, initial encounter for closed fracture: Secondary | ICD-10-CM

## 2017-03-22 DIAGNOSIS — Z7982 Long term (current) use of aspirin: Secondary | ICD-10-CM | POA: Insufficient documentation

## 2017-03-22 DIAGNOSIS — Y9366 Activity, soccer: Secondary | ICD-10-CM | POA: Insufficient documentation

## 2017-03-22 DIAGNOSIS — S82452A Displaced comminuted fracture of shaft of left fibula, initial encounter for closed fracture: Secondary | ICD-10-CM

## 2017-03-22 DIAGNOSIS — Z01811 Encounter for preprocedural respiratory examination: Secondary | ICD-10-CM

## 2017-03-22 DIAGNOSIS — Z419 Encounter for procedure for purposes other than remedying health state, unspecified: Secondary | ICD-10-CM

## 2017-03-22 DIAGNOSIS — R52 Pain, unspecified: Secondary | ICD-10-CM

## 2017-03-22 HISTORY — DX: Gastro-esophageal reflux disease without esophagitis: K21.9

## 2017-03-22 LAB — CBC
HEMATOCRIT: 39.8 % (ref 39.0–52.0)
HEMOGLOBIN: 13.8 g/dL (ref 13.0–17.0)
MCH: 28.3 pg (ref 26.0–34.0)
MCHC: 34.7 g/dL (ref 30.0–36.0)
MCV: 81.7 fL (ref 78.0–100.0)
Platelets: 260 10*3/uL (ref 150–400)
RBC: 4.87 MIL/uL (ref 4.22–5.81)
RDW: 12.9 % (ref 11.5–15.5)
WBC: 14.4 10*3/uL — ABNORMAL HIGH (ref 4.0–10.5)

## 2017-03-22 LAB — BASIC METABOLIC PANEL
Anion gap: 11 (ref 5–15)
BUN: 13 mg/dL (ref 6–20)
CHLORIDE: 108 mmol/L (ref 101–111)
CO2: 22 mmol/L (ref 22–32)
CREATININE: 1.01 mg/dL (ref 0.61–1.24)
Calcium: 10.3 mg/dL (ref 8.9–10.3)
GFR calc non Af Amer: >60 mL/min (ref >60)
Glucose, Bld: 109 mg/dL — ABNORMAL HIGH (ref 65–99)
POTASSIUM: 3.6 mmol/L (ref 3.5–5.1)
Sodium: 141 mmol/L (ref 135–145)

## 2017-03-22 MED ORDER — ACETAMINOPHEN 650 MG RE SUPP: 650.0000 mg | Freq: Four times a day (QID) | RECTAL | Status: DC | PRN

## 2017-03-22 MED ORDER — PROPOFOL 10 MG/ML IV BOLUS
1.0000 mg/kg | Freq: Once | INTRAVENOUS | Status: AC
  Administered 2017-03-23: 73.5 mg via INTRAVENOUS
  Filled 2017-03-22: qty 20

## 2017-03-22 MED ORDER — MORPHINE SULFATE (PF) 4 MG/ML IV SOLN
4.0000 mg | Freq: Once | INTRAVENOUS | Status: AC
  Administered 2017-03-22: 4 mg via INTRAVENOUS
  Filled 2017-03-22: qty 1

## 2017-03-22 MED ORDER — METHOCARBAMOL 500 MG PO TABS
500.0000 mg | ORAL_TABLET | Freq: Four times a day (QID) | ORAL | Status: DC | PRN
  Administered 2017-03-23 – 2017-03-24 (×2): 500 mg via ORAL
  Filled 2017-03-22 (×2): qty 1

## 2017-03-22 MED ORDER — ONDANSETRON HCL 4 MG/2ML IJ SOLN: 4.0000 mg | Freq: Four times a day (QID) | INTRAMUSCULAR | Status: DC | PRN

## 2017-03-22 MED ORDER — LACTATED RINGERS IV SOLN
INTRAVENOUS | Status: DC
  Administered 2017-03-23: 03:00:00 via INTRAVENOUS

## 2017-03-22 MED ORDER — MORPHINE SULFATE (PF) 2 MG/ML IV SOLN: 2.0000 mg | INTRAVENOUS | Status: DC | PRN

## 2017-03-22 MED ORDER — DEXTROSE 5 % IV SOLN: 500.0000 mg | Freq: Four times a day (QID) | INTRAVENOUS | Status: DC | PRN

## 2017-03-22 MED ORDER — ACETAMINOPHEN 325 MG PO TABS: 650.0000 mg | ORAL_TABLET | Freq: Four times a day (QID) | ORAL | Status: DC | PRN

## 2017-03-22 MED ORDER — ONDANSETRON HCL 4 MG PO TABS: 4.0000 mg | ORAL_TABLET | Freq: Four times a day (QID) | ORAL | Status: DC | PRN

## 2017-03-22 NOTE — ED Notes (Signed)
Bed: WA22 Expected date:  Expected time:  Means of arrival:  Comments: Hall D 

## 2017-03-22 NOTE — H&P (Signed)
Cody Saupe, MD Chief Complaint: Left tib-fib fracture secondary to soccer injury. History: Pleasant 31 year old gentleman is in his usual state of good to excellent health until he was unfortunately involved in a soccer injury earlier today. Patient states he fell and someone fell on his left lower leg and noted immediate pain and obvious deformity. Patient states that he is otherwise healthy with no other medical issues except for hypertension.  Past Medical History:  Diagnosis Date  . Chest pain   . Essential hypertension 06/03/2013  . GERD (gastroesophageal reflux disease)   . Heart murmur    As a Child  . Hypertension    Borderline    No Known Allergies  No current facility-administered medications on file prior to encounter.    Current Outpatient Prescriptions on File Prior to Encounter  Medication Sig Dispense Refill  . amLODipine (NORVASC) 5 MG tablet TAKE 1 TABLET BY MOUTH DAILY 90 tablet 2  . aspirin 325 MG tablet Take 325 mg by mouth as needed for pain.    . Calcium Carbonate Antacid (TUMS ULTRA PO) Take by mouth as needed.    Marland Kitchen omeprazole (PRILOSEC) 40 MG capsule Take 40 mg by mouth daily.      Physical Exam: Vitals:   03/22/17 2056 03/22/17 2230  BP: 133/85 139/81  Pulse: 89 90  Resp: 18   Temp: 99.2 F (37.3 C)   SpO2: 100% 100%  He is alert and oriented 3.  No shortness of breath or chest pain.  Abdomen soft and nontender. No loss of bowel and bladder control.  Intact dorsalis pedis, posterior tibialis pulses bilaterally. 2+ and symmetrical. Sensation to light touch is intact in the foot and calf bilaterally. He is moving all toes without difficulty. No abrasion contusion or laceration is noted over the left distal tibia.   Compartments are soft and nontender. He has an obvious deformity and pain with light palpation over the distal third of the tibia. No knee pain or swelling is noted.  Image: Dg Tibia/fibula Left  Result Date: 03/22/2017 CLINICAL  DATA:  Soccer injury EXAM: LEFT TIBIA AND FIBULA - 2 VIEW COMPARISON:  None. FINDINGS: There are fractures of the tibia and fibula at the junction of the middle and distal diaphyseal thirds. There is 1/2 shaft with lateral displacement at the tibial fracture. There is complete lateral displacement at the fibular fracture. There is mild valgus angulation. IMPRESSION: Diaphyseal fractures of the tibia and fibula at the junction of the middle and distal thirds. Electronically Signed   By: Ellery Plunk M.D.   On: 03/22/2017 21:50   Dg Ankle Complete Left  Result Date: 03/22/2017 CLINICAL DATA:  Soccer injury tonight. EXAM: LEFT ANKLE COMPLETE - 3+ VIEW COMPARISON:  None. FINDINGS: Diaphyseal fractures of the tibia and fibula approximately 12 cm above the ankle, with lateral displacement and slight comminution. Ankle joint appears intact. IMPRESSION: Diaphyseal fractures of the tibia and fibula. Electronically Signed   By: Ellery Plunk M.D.   On: 03/22/2017 21:51    A/P: This is a pleasant otherwise healthy 31 year old gentleman who was playing soccer earlier today and suffered a left distal tib-fib injury. Imaging studies confirmed a displaced comminuted distal tib-fib fracture. Patient is grossly neurologically intact with no signs of a compartment syndrome.  At this point time I have recommend the patient be slightly sedated so that a more appropriate splint can be applied. I will plan on having him transferred to [facility] was evening so that tomorrow he can  undergo definitive fracture management. I will consult the orto traumatologist to move forward with intramedullary nail fixation of the left tibia.

## 2017-03-22 NOTE — ED Triage Notes (Signed)
Was playing soccer and fell and someone fell on left lower leg with deformity noted good circulation and sensation noted good color noted. No other injuries.

## 2017-03-22 NOTE — ED Provider Notes (Signed)
[facility]-EMERGENCY DEPT Provider Note   CSN: [id number] Arrival date & time: 03/22/17  2045     History   Chief Complaint Chief Complaint  Patient presents with  . Leg Injury    HPI Cody Fisher is a 31 y.o. male.  HPI  31 y.o. male with a hx of HTN, presents to the Emergency Department today due to left lower leg deformity. Notes playing soccer as goalkeeper and sliding with his leg out. Someone stepped on his leg with their cleats around the middle of his lower leg. Notes leg "flopping" afterwards with immediate pain. Rates 10/10. Throbbing. Notified EMS. Given 250 Fentanyl en route. NVI. Denies numbness/tingling. No significant past medical history. No other symptoms noted.    Past Medical History:  Diagnosis Date  . Chest pain   . Essential hypertension 06/03/2013  . GERD (gastroesophageal reflux disease)   . Heart murmur    As a Child  . Hypertension    Borderline    Patient Active Problem List   Diagnosis Date Noted  . Essential hypertension 06/03/2013  . Chest pain 03/09/2013    History reviewed. No pertinent surgical history.     Home Medications    Prior to Admission medications   Medication Sig Start Date End Date Taking? Authorizing Provider  amLODipine (NORVASC) 5 MG tablet TAKE 1 TABLET BY MOUTH DAILY 10/16/16   Lars Masson, MD  aspirin 325 MG tablet Take 325 mg by mouth as needed for pain.    [provider]  Calcium Carbonate Antacid (TUMS ULTRA PO) Take by mouth as needed.    [provider]  omeprazole (PRILOSEC) 40 MG capsule Take 40 mg by mouth daily.    [provider]    Family History Family History  Problem Relation Age of Onset  . Diabetes Unknown        family history  . Hypertension Mother   . Diabetes Father     Social History Social History  Substance Use Topics  . Smoking status: Current Some Day Smoker  . Smokeless tobacco: Never Used     Comment: Quit 2014  . Alcohol use Yes     Comment:  occ     Allergies   Patient has no known allergies.   Review of Systems Review of Systems ROS reviewed and all are negative for acute change except as noted in the HPI.  Physical Exam Updated Vital Signs BP 139/81   Pulse 90   Temp 99.2 F (37.3 C) (Oral)   Resp 18   Ht  (1.778 m)   Wt 73.5 kg (162 lb)   SpO2 100%   BMI 23.24 kg/m   Physical Exam  Constitutional: He is oriented to person, place, and time. Vital signs are normal. He appears well-developed and well-nourished.  HENT:  Head: Normocephalic and atraumatic.  Right Ear: Hearing normal.  Left Ear: Hearing normal.  Eyes: Pupils are equal, round, and reactive to light. Conjunctivae and EOM are normal.  Neck: Normal range of motion. Neck supple.  Cardiovascular: Normal rate and regular rhythm.   Pulmonary/Chest: Effort normal.  Musculoskeletal: Normal range of motion.  Obvious deformity noted to left mid shaft of tibia. No break in skin or tinting. NVI. Distal pulses appreciated. Compartments soft  Neurological: He is alert and oriented to person, place, and time.  Skin: Skin is warm and dry.  Psychiatric: He has a normal mood and affect. His speech is normal and behavior is normal. Thought content normal.  Nursing note and vitals reviewed.  ED Treatments / Results  Labs (all labs ordered are listed, but only abnormal results are displayed) Labs Reviewed - No data to display  EKG  EKG Interpretation None       Radiology Dg Tibia/fibula Left  Result Date: 03/22/2017 CLINICAL DATA:  Soccer injury EXAM: LEFT TIBIA AND FIBULA - 2 VIEW COMPARISON:  None. FINDINGS: There are fractures of the tibia and fibula at the junction of the middle and distal diaphyseal thirds. There is 1/2 shaft with lateral displacement at the tibial fracture. There is complete lateral displacement at the fibular fracture. There is mild valgus angulation. IMPRESSION: Diaphyseal fractures of the tibia and fibula at the junction of  the middle and distal thirds. Electronically Signed   By: Ellery Plunk M.D.   On: 03/22/2017 21:50   Dg Ankle Complete Left  Result Date: 03/22/2017 CLINICAL DATA:  Soccer injury tonight. EXAM: LEFT ANKLE COMPLETE - 3+ VIEW COMPARISON:  None. FINDINGS: Diaphyseal fractures of the tibia and fibula approximately 12 cm above the ankle, with lateral displacement and slight comminution. Ankle joint appears intact. IMPRESSION: Diaphyseal fractures of the tibia and fibula. Electronically Signed   By: Ellery Plunk M.D.   On: 03/22/2017 21:51    Procedures Reduction of fracture Date/Time: 03/23/2017 12:38 AM Performed by: Audry Pili Authorized by: Audry Pili  Consent: Verbal consent obtained. Written consent obtained. Risks and benefits: risks, benefits and alternatives were discussed Consent given by: patient Patient understanding: patient states understanding of the procedure being performed Patient consent: the patient's understanding of the procedure matches consent given Procedure consent: procedure consent matches procedure scheduled Relevant documents: relevant documents present and verified Site marked: the operative site was marked Imaging studies: imaging studies available Patient identity confirmed: verbally with patient and arm band Time out: Immediately prior to procedure a "time out" was called to verify the correct patient, procedure, equipment, support staff and site/side marked as required.  Sedation: Patient sedated: yes Sedatives: propofol Vitals: Vital signs were monitored during sedation. Patient tolerance: Patient tolerated the procedure well with no immediate complications Comments: Reduction of fracture successful. Improvement of pain status. Splint applied and NVI with distal pulses appreciated post application.     (including critical care time)   Medications Ordered in ED Medications  propofol (DIPRIVAN) 10 mg/mL bolus/IV push 73.5 mg (not  administered)  acetaminophen (TYLENOL) tablet 650 mg (not administered)    Or  acetaminophen (TYLENOL) suppository 650 mg (not administered)  lactated ringers infusion (not administered)  morphine 2 MG/ML injection 2 mg (not administered)  methocarbamol (ROBAXIN) tablet 500 mg (not administered)    Or  methocarbamol (ROBAXIN) 500 mg in dextrose 5 % 50 mL IVPB (not administered)  ondansetron (ZOFRAN) tablet 4 mg (not administered)    Or  ondansetron (ZOFRAN) injection 4 mg (not administered)  morphine 4 MG/ML injection 4 mg (4 mg Intravenous Given 03/22/17 2107)  morphine 4 MG/ML injection 4 mg (4 mg Intravenous Given 03/22/17 2245)  HYDROmorphone (DILAUDID) injection 1 mg (1 mg Intravenous Given 03/23/17 0012)   Initial Impression / Assessment and Plan / ED Course  I have reviewed the triage vital signs and the nursing notes.  Pertinent labs & imaging results that were available during my care of the patient were reviewed by me and considered in my medical decision making (see chart for details).  Final Clinical Impressions(s) / ED Diagnoses   {I have reviewed and evaluated the relevant imaging studies.  {I have reviewed  the relevant previous healthcare records.  {I obtained HPI from historian.   ED Course:  Assessment: Patient X-Ray shows diaphyseal fractures of the tib/fib at junction of middle and distal thirds. NVI. Distal pulses appreciated. Compartments soft. Consult to orthopedics (Dr. Shon Baton) to see in ED. Reduction was performed by attending and myself. Procedural sedation was used with Propofol. Reduced successfully. NVI and distal pulses appreciated post reduction with splint applied. Will transfer to [facility] for surgical intervention tomorrow morning.   Disposition/Plan:  Admit Pt acknowledges and agrees with plan  Supervising Physician Mesner, Barbara Cower, MD  Final diagnoses:  Closed displaced comminuted fracture of shaft of left tibia, initial encounter  Closed displaced  comminuted fracture of shaft of left fibula, initial encounter    New Prescriptions New Prescriptions   No medications on file     Audry Pili, Cordelia Poche 03/23/17 0040    Mesner, Barbara Cower, MD 03/23/17 0100

## 2017-03-22 NOTE — ED Notes (Signed)
Bed: [facility] Expected date:  Expected time:  Means of arrival:  Comments: 31 yo M/ poss fx

## 2017-03-23 ENCOUNTER — Encounter (HOSPITAL_COMMUNITY)
Admission: EM | Disposition: A | Discharge status: Home / Self Care | Payer: Self-pay | Source: Home / Self Care | Attending: Emergency Medicine

## 2017-03-23 ENCOUNTER — Inpatient Hospital Stay (HOSPITAL_COMMUNITY): Payer: 59 | Admitting: Certified Registered Nurse Anesthetist

## 2017-03-23 ENCOUNTER — Encounter (HOSPITAL_COMMUNITY): Payer: Self-pay | Admitting: Emergency Medicine

## 2017-03-23 ENCOUNTER — Inpatient Hospital Stay (HOSPITAL_COMMUNITY): Payer: 59

## 2017-03-23 HISTORY — PX: TIBIA IM NAIL INSERTION: SHX2516

## 2017-03-23 LAB — PROTIME-INR
INR: 1.12
Prothrombin Time: 14.3 seconds (ref 11.4–15.2)

## 2017-03-23 LAB — HIV ANTIBODY (ROUTINE TESTING W REFLEX): HIV Screen 4th Generation wRfx: NONREACTIVE

## 2017-03-23 LAB — SURGICAL PCR SCREEN
MRSA, PCR: NEGATIVE
STAPHYLOCOCCUS AUREUS: POSITIVE — AB

## 2017-03-23 SURGERY — INSERTION, INTRAMEDULLARY ROD, TIBIA
Anesthesia: General | Laterality: Left

## 2017-03-23 SURGERY — INSERTION, INTRAMEDULLARY ROD, TIBIA
Anesthesia: General | Site: Leg Lower | Laterality: Left

## 2017-03-23 MED ORDER — CEFAZOLIN SODIUM-DEXTROSE 1-4 GM/50ML-% IV SOLN
1.0000 g | Freq: Four times a day (QID) | INTRAVENOUS | Status: AC
  Administered 2017-03-23 – 2017-03-24 (×3): 1 g via INTRAVENOUS
  Filled 2017-03-23 (×3): qty 50

## 2017-03-23 MED ORDER — PROPOFOL 10 MG/ML IV BOLUS
INTRAVENOUS | Status: AC | PRN
  Administered 2017-03-23 (×2): 40 mg via INTRAVENOUS
  Administered 2017-03-23: 50 mg via INTRAVENOUS
  Administered 2017-03-23: 40 mg via INTRAVENOUS

## 2017-03-23 MED ORDER — KETOROLAC TROMETHAMINE 30 MG/ML IJ SOLN
INTRAMUSCULAR | Status: AC
  Administered 2017-03-23: 30 mg via INTRAVENOUS
  Filled 2017-03-23: qty 1

## 2017-03-23 MED ORDER — HYDROXYZINE HCL 25 MG PO TABS
25.0000 mg | ORAL_TABLET | Freq: Three times a day (TID) | ORAL | Status: DC | PRN
  Administered 2017-03-23: 25 mg via ORAL
  Filled 2017-03-23: qty 1

## 2017-03-23 MED ORDER — HYDROMORPHONE HCL 1 MG/ML IJ SOLN
INTRAMUSCULAR | Status: AC
  Administered 2017-03-23: 0.5 mg via INTRAVENOUS
  Filled 2017-03-23: qty 1

## 2017-03-23 MED ORDER — ONDANSETRON HCL 4 MG/2ML IJ SOLN
INTRAMUSCULAR | Status: DC | PRN
  Administered 2017-03-23: 4 mg via INTRAVENOUS

## 2017-03-23 MED ORDER — FENTANYL CITRATE (PF) 250 MCG/5ML IJ SOLN
INTRAMUSCULAR | Status: AC
  Filled 2017-03-23: qty 5

## 2017-03-23 MED ORDER — OXYCODONE HCL 5 MG PO TABS: ORAL_TABLET | ORAL | 0 refills | Status: AC

## 2017-03-23 MED ORDER — PROPOFOL 10 MG/ML IV BOLUS
INTRAVENOUS | Status: AC
  Filled 2017-03-23: qty 20

## 2017-03-23 MED ORDER — ROCURONIUM BROMIDE 100 MG/10ML IV SOLN
INTRAVENOUS | Status: DC | PRN
  Administered 2017-03-23: 40 mg via INTRAVENOUS

## 2017-03-23 MED ORDER — PHENYLEPHRINE 40 MCG/ML (10ML) SYRINGE FOR IV PUSH (FOR BLOOD PRESSURE SUPPORT)
PREFILLED_SYRINGE | INTRAVENOUS | Status: AC
  Filled 2017-03-23: qty 10

## 2017-03-23 MED ORDER — VANCOMYCIN HCL 1000 MG IV SOLR
INTRAVENOUS | Status: DC | PRN
  Administered 2017-03-23: 1000 mg via TOPICAL

## 2017-03-23 MED ORDER — DEXAMETHASONE SODIUM PHOSPHATE 10 MG/ML IJ SOLN
INTRAMUSCULAR | Status: AC
  Filled 2017-03-23: qty 1

## 2017-03-23 MED ORDER — HYDROXYZINE HCL 50 MG/ML IM SOLN
25.0000 mg | Freq: Three times a day (TID) | INTRAMUSCULAR | Status: DC | PRN
  Filled 2017-03-23: qty 1

## 2017-03-23 MED ORDER — CELECOXIB 200 MG PO CAPS
200.0000 mg | ORAL_CAPSULE | Freq: Two times a day (BID) | ORAL | Status: DC
  Administered 2017-03-23 – 2017-03-24 (×3): 200 mg via ORAL
  Filled 2017-03-23 (×3): qty 1

## 2017-03-23 MED ORDER — KETOROLAC TROMETHAMINE 30 MG/ML IJ SOLN
30.0000 mg | Freq: Once | INTRAMUSCULAR | Status: DC | PRN
  Administered 2017-03-23: 30 mg via INTRAVENOUS

## 2017-03-23 MED ORDER — ONDANSETRON HCL 4 MG PO TABS: 4.0000 mg | ORAL_TABLET | Freq: Three times a day (TID) | ORAL | 1 refills | Status: AC | PRN

## 2017-03-23 MED ORDER — PROPOFOL 10 MG/ML IV BOLUS
INTRAVENOUS | Status: DC | PRN
  Administered 2017-03-23: 200 mg via INTRAVENOUS

## 2017-03-23 MED ORDER — SODIUM CHLORIDE 0.9 % IV SOLN
INTRAVENOUS | Status: AC | PRN
  Administered 2017-03-23: 1000 mL/h via INTRAVENOUS

## 2017-03-23 MED ORDER — BISACODYL 5 MG PO TBEC: 5.0000 mg | DELAYED_RELEASE_TABLET | Freq: Every day | ORAL | 0 refills | Status: AC | PRN

## 2017-03-23 MED ORDER — MIDAZOLAM HCL 5 MG/5ML IJ SOLN
INTRAMUSCULAR | Status: DC | PRN
  Administered 2017-03-23: 2 mg via INTRAVENOUS

## 2017-03-23 MED ORDER — HYDROMORPHONE HCL 1 MG/ML IJ SOLN
1.0000 mg | Freq: Once | INTRAMUSCULAR | Status: AC
  Administered 2017-03-23: 1 mg via INTRAVENOUS
  Filled 2017-03-23: qty 1

## 2017-03-23 MED ORDER — CEFAZOLIN SODIUM-DEXTROSE 2-3 GM-% IV SOLR
INTRAVENOUS | Status: DC | PRN
  Administered 2017-03-23: 2 g via INTRAVENOUS

## 2017-03-23 MED ORDER — MIDAZOLAM HCL 2 MG/2ML IJ SOLN
INTRAMUSCULAR | Status: AC
  Filled 2017-03-23: qty 2

## 2017-03-23 MED ORDER — DOCUSATE SODIUM 100 MG PO CAPS
100.0000 mg | ORAL_CAPSULE | Freq: Two times a day (BID) | ORAL | Status: DC
  Administered 2017-03-23 – 2017-03-24 (×3): 100 mg via ORAL
  Filled 2017-03-23 (×3): qty 1

## 2017-03-23 MED ORDER — VANCOMYCIN HCL 1000 MG IV SOLR
INTRAVENOUS | Status: AC
  Filled 2017-03-23: qty 1000

## 2017-03-23 MED ORDER — ONDANSETRON HCL 4 MG/2ML IJ SOLN
INTRAMUSCULAR | Status: AC
  Filled 2017-03-23: qty 2

## 2017-03-23 MED ORDER — HYDROMORPHONE HCL 1 MG/ML IJ SOLN: 1.0000 mg | INTRAMUSCULAR | Status: DC | PRN

## 2017-03-23 MED ORDER — MORPHINE SULFATE (PF) 4 MG/ML IV SOLN
2.0000 mg | INTRAVENOUS | Status: DC | PRN
  Administered 2017-03-23: 2 mg via INTRAVENOUS
  Filled 2017-03-23: qty 1

## 2017-03-23 MED ORDER — METOCLOPRAMIDE HCL 5 MG PO TABS
5.0000 mg | ORAL_TABLET | Freq: Three times a day (TID) | ORAL | Status: DC | PRN
Start: 2017-03-23 — End: 2017-03-24

## 2017-03-23 MED ORDER — METOCLOPRAMIDE HCL 5 MG/ML IJ SOLN: 5.0000 mg | Freq: Three times a day (TID) | INTRAMUSCULAR | Status: DC | PRN

## 2017-03-23 MED ORDER — OXYCODONE HCL 5 MG PO TABS
5.0000 mg | ORAL_TABLET | ORAL | Status: DC | PRN
  Administered 2017-03-23 – 2017-03-24 (×3): 10 mg via ORAL
  Filled 2017-03-23 (×3): qty 2

## 2017-03-23 MED ORDER — HYDROMORPHONE HCL 1 MG/ML IJ SOLN
0.2500 mg | INTRAMUSCULAR | Status: DC | PRN
  Administered 2017-03-23 (×2): 0.5 mg via INTRAVENOUS

## 2017-03-23 MED ORDER — DEXAMETHASONE SODIUM PHOSPHATE 10 MG/ML IJ SOLN
INTRAMUSCULAR | Status: DC | PRN
  Administered 2017-03-23: 10 mg via INTRAVENOUS

## 2017-03-23 MED ORDER — MEPERIDINE HCL 25 MG/ML IJ SOLN: 6.2500 mg | INTRAMUSCULAR | Status: DC | PRN

## 2017-03-23 MED ORDER — FENTANYL CITRATE (PF) 100 MCG/2ML IJ SOLN
INTRAMUSCULAR | Status: DC | PRN
  Administered 2017-03-23 (×2): 50 ug via INTRAVENOUS
  Administered 2017-03-23: 100 ug via INTRAVENOUS
  Administered 2017-03-23 (×2): 50 ug via INTRAVENOUS

## 2017-03-23 MED ORDER — LACTATED RINGERS IV SOLN
INTRAVENOUS | Status: DC
  Administered 2017-03-23 (×4): via INTRAVENOUS

## 2017-03-23 MED ORDER — PROMETHAZINE HCL 25 MG/ML IJ SOLN: 6.2500 mg | INTRAMUSCULAR | Status: DC | PRN

## 2017-03-23 MED ORDER — BUPIVACAINE HCL (PF) 0.25 % IJ SOLN
INTRAMUSCULAR | Status: AC
  Filled 2017-03-23: qty 30

## 2017-03-23 MED ORDER — DIPHENHYDRAMINE HCL 12.5 MG/5ML PO ELIX: 12.5000 mg | ORAL_SOLUTION | ORAL | Status: DC | PRN

## 2017-03-23 MED ORDER — MAGNESIUM HYDROXIDE 400 MG/5ML PO SUSP: 30.0000 mL | Freq: Every day | ORAL | Status: DC | PRN

## 2017-03-23 MED ORDER — ACETAMINOPHEN 500 MG PO TABS: 1000.0000 mg | ORAL_TABLET | Freq: Three times a day (TID) | ORAL | 0 refills | Status: AC

## 2017-03-23 MED ORDER — ACETAMINOPHEN 500 MG PO TABS
1000.0000 mg | ORAL_TABLET | Freq: Four times a day (QID) | ORAL | Status: AC
  Administered 2017-03-23 – 2017-03-24 (×3): 1000 mg via ORAL
  Filled 2017-03-23 (×3): qty 2

## 2017-03-23 MED ORDER — ASPIRIN 81 MG PO TABS: 81.0000 mg | ORAL_TABLET | Freq: Every day | ORAL | 0 refills | Status: DC

## 2017-03-23 MED ORDER — SUGAMMADEX SODIUM 200 MG/2ML IV SOLN
INTRAVENOUS | Status: DC | PRN
  Administered 2017-03-23: 200 mg via INTRAVENOUS

## 2017-03-23 MED ORDER — BUPIVACAINE HCL (PF) 0.25 % IJ SOLN
INTRAMUSCULAR | Status: DC | PRN
  Administered 2017-03-23: 20 mL

## 2017-03-23 MED ORDER — 0.9 % SODIUM CHLORIDE (POUR BTL) OPTIME
TOPICAL | Status: DC | PRN
  Administered 2017-03-23: 1000 mL

## 2017-03-23 MED ORDER — LIDOCAINE HCL (CARDIAC) 20 MG/ML IV SOLN
INTRAVENOUS | Status: DC | PRN
  Administered 2017-03-23: 100 mg via INTRAVENOUS

## 2017-03-23 SURGICAL SUPPLY — 77 items
BANDAGE ACE 4X5 VEL STRL LF (GAUZE/BANDAGES/DRESSINGS) ×3 IMPLANT
BANDAGE ACE 6X5 VEL STRL LF (GAUZE/BANDAGES/DRESSINGS) ×3 IMPLANT
BANDAGE ELASTIC 6 VELCRO ST LF (GAUZE/BANDAGES/DRESSINGS) ×3 IMPLANT
BIT DRILL AO GAMMA 4.2X130 (BIT) ×3 IMPLANT
BIT DRILL AO GAMMA 4.2X340 (BIT) ×3 IMPLANT
BLADE SURG 10 STRL SS (BLADE) ×3 IMPLANT
BNDG COHESIVE 4X5 TAN STRL (GAUZE/BANDAGES/DRESSINGS) ×3 IMPLANT
BNDG GAUZE ELAST 4 BULKY (GAUZE/BANDAGES/DRESSINGS) ×3 IMPLANT
CHLORAPREP W/TINT 26ML (MISCELLANEOUS) ×3 IMPLANT
COVER MAYO STAND STRL (DRAPES) ×3 IMPLANT
COVER SURGICAL LIGHT HANDLE (MISCELLANEOUS) ×6 IMPLANT
CUFF TOURNIQUET SINGLE 34IN LL (TOURNIQUET CUFF) IMPLANT
CUFF TOURNIQUET SINGLE 44IN (TOURNIQUET CUFF) IMPLANT
DECANTER SPIKE VIAL GLASS SM (MISCELLANEOUS) ×3 IMPLANT
DRAPE C-ARM 42X72 X-RAY (DRAPES) ×3 IMPLANT
DRAPE C-ARMOR (DRAPES) IMPLANT
DRAPE HALF SHEET 40X57 (DRAPES) ×3 IMPLANT
DRAPE IMP U-DRAPE 54X76 (DRAPES) ×3 IMPLANT
DRESSING OPSITE X SMALL 2X3 (GAUZE/BANDAGES/DRESSINGS) ×3 IMPLANT
DRSG ADAPTIC 3X8 NADH LF (GAUZE/BANDAGES/DRESSINGS) ×3 IMPLANT
DRSG AQUACEL AG ADV 3.5X 4 (GAUZE/BANDAGES/DRESSINGS) ×3 IMPLANT
DRSG TEGADERM 4X4.75 (GAUZE/BANDAGES/DRESSINGS) ×6 IMPLANT
ELECT CAUTERY BLADE 6.4 (BLADE) ×3 IMPLANT
ELECT REM PT RETURN 9FT ADLT (ELECTROSURGICAL) ×3
ELECTRODE REM PT RTRN 9FT ADLT (ELECTROSURGICAL) ×1 IMPLANT
GAUZE SPONGE 4X4 12PLY STRL (GAUZE/BANDAGES/DRESSINGS) ×3 IMPLANT
GAUZE XEROFORM 1X8 LF (GAUZE/BANDAGES/DRESSINGS) ×3 IMPLANT
GLOVE BIO SURGEON STRL SZ7.5 (GLOVE) ×6 IMPLANT
GLOVE BIOGEL PI IND STRL 8 (GLOVE) ×2 IMPLANT
GLOVE BIOGEL PI INDICATOR 8 (GLOVE) ×4
GOWN STRL REUS W/ TWL LRG LVL3 (GOWN DISPOSABLE) ×3 IMPLANT
GOWN STRL REUS W/TWL LRG LVL3 (GOWN DISPOSABLE) ×6
GUIDEROD T2 3X1000 (ROD) ×3 IMPLANT
GUIDEWIRE GAMMA (WIRE) ×6 IMPLANT
K-WIRE FIXATION 3X285 COATED (WIRE) ×6
KIT BASIN OR (CUSTOM PROCEDURE TRAY) ×3 IMPLANT
KIT ROOM TURNOVER OR (KITS) ×3 IMPLANT
KWIRE FIXATION 3X285 COATED (WIRE) ×2 IMPLANT
MANIFOLD NEPTUNE II (INSTRUMENTS) ×3 IMPLANT
NAIL ELAS INSERT SLV SPI 8-11 (MISCELLANEOUS) ×3 IMPLANT
NAIL TIBIAL STD 9X345MM (Nail) ×3 IMPLANT
NEEDLE 22X1 1/2 (OR ONLY) (NEEDLE) ×3 IMPLANT
NS IRRIG 1000ML POUR BTL (IV SOLUTION) ×3 IMPLANT
PACK ORTHO EXTREMITY (CUSTOM PROCEDURE TRAY) ×3 IMPLANT
PACK UNIVERSAL I (CUSTOM PROCEDURE TRAY) ×3 IMPLANT
PAD ARMBOARD 7.5X6 YLW CONV (MISCELLANEOUS) ×6 IMPLANT
PAD CAST 4YDX4 CTTN HI CHSV (CAST SUPPLIES) ×1 IMPLANT
PADDING CAST COTTON 4X4 STRL (CAST SUPPLIES) ×2
REAMER INTRAMEDULLARY 8MM 510 (MISCELLANEOUS) ×3 IMPLANT
SCREW LOCKING T2 F/T  5MMX45MM (Screw) ×2 IMPLANT
SCREW LOCKING T2 F/T  5X32.5MM (Screw) ×2 IMPLANT
SCREW LOCKING T2 F/T  5X37.5MM (Screw) ×2 IMPLANT
SCREW LOCKING T2 F/T  5X42.5MM (Screw) ×2 IMPLANT
SCREW LOCKING T2 F/T 5MMX45MM (Screw) ×1 IMPLANT
SCREW LOCKING T2 F/T 5X32.5MM (Screw) ×1 IMPLANT
SCREW LOCKING T2 F/T 5X37.5MM (Screw) ×1 IMPLANT
SCREW LOCKING T2 F/T 5X42.5MM (Screw) ×1 IMPLANT
SPONGE LAP 18X18 X RAY DECT (DISPOSABLE) ×3 IMPLANT
STAPLER VISISTAT 35W (STAPLE) IMPLANT
STOCKINETTE IMPERVIOUS LG (DRAPES) ×3 IMPLANT
SUT ETHILON 3 0 PS 1 (SUTURE) ×6 IMPLANT
SUT MON AB 2-0 CT1 27 (SUTURE) ×3 IMPLANT
SUT VIC AB 0 CT1 27 (SUTURE) ×2
SUT VIC AB 0 CT1 27XBRD ANBCTR (SUTURE) ×1 IMPLANT
SUT VIC AB 1 CT1 27 (SUTURE) ×2
SUT VIC AB 1 CT1 27XBRD ANBCTR (SUTURE) ×1 IMPLANT
SUT VIC AB 2-0 CT1 27 (SUTURE) ×2
SUT VIC AB 2-0 CT1 TAPERPNT 27 (SUTURE) ×1 IMPLANT
SYR BULB IRRIGATION 50ML (SYRINGE) ×3 IMPLANT
SYR CONTROL 10ML LL (SYRINGE) ×3 IMPLANT
TOWEL OR 17X24 6PK STRL BLUE (TOWEL DISPOSABLE) ×3 IMPLANT
TOWEL OR 17X26 10 PK STRL BLUE (TOWEL DISPOSABLE) ×3 IMPLANT
TOWEL OR NON WOVEN STRL DISP B (DISPOSABLE) ×3 IMPLANT
TUBE CONNECTING 12'X1/4 (SUCTIONS) ×1
TUBE CONNECTING 12X1/4 (SUCTIONS) ×2 IMPLANT
WATER STERILE IRR 1000ML POUR (IV SOLUTION) ×3 IMPLANT
YANKAUER SUCT BULB TIP NO VENT (SUCTIONS) ×3 IMPLANT

## 2017-03-23 NOTE — Progress Notes (Signed)
Orthopedic Tech Progress Note Patient Details:  Cody Fisher 1986/01/15 [id number]  Ortho Devices Type of Ortho Device: Crutches Ortho Device/Splint Location: As requested by OR staff Large Cam walker provided to OR Nurse station.   Ortho Device/Splint Interventions: Ordered, Adjustment   Jennye Moccasin 03/23/2017, 4:33 PM

## 2017-03-23 NOTE — Anesthesia Preprocedure Evaluation (Signed)
Anesthesia Evaluation  Patient identified by MRN, date of birth, ID band Patient awake    Reviewed: Allergy & Precautions, NPO status , Patient's Chart, lab work & pertinent test results  Airway Mallampati: I       Dental no notable dental hx. (+) Teeth Intact, Chipped,    Pulmonary Current Smoker,    breath sounds clear to auscultation       Cardiovascular hypertension, Pt. on medications Normal cardiovascular exam Rhythm:Regular Rate:Normal     Neuro/Psych negative neurological ROS  negative psych ROS   GI/Hepatic Neg liver ROS, GERD  Medicated and Controlled,  Endo/Other  negative endocrine ROS  Renal/GU negative Renal ROS  negative genitourinary   Musculoskeletal negative musculoskeletal ROS (+)   Abdominal Normal abdominal exam  (+)   Peds  Hematology negative hematology ROS (+)   Anesthesia Other Findings   Reproductive/Obstetrics                             Anesthesia Physical Anesthesia Plan  ASA: II  Anesthesia Plan: General   Post-op Pain Management:    Induction: Intravenous  PONV Risk Score and Plan: 2 and Ondansetron and Dexamethasone  Airway Management Planned: Oral ETT  Additional Equipment:   Intra-op Plan:   Post-operative Plan: Extubation in OR  Informed Consent: I have reviewed the patients History and Physical, chart, labs and discussed the procedure including the risks, benefits and alternatives for the proposed anesthesia with the patient or authorized representative who has indicated his/her understanding and acceptance.     Plan Discussed with: CRNA and Surgeon  Anesthesia Plan Comments:         Anesthesia Quick Evaluation

## 2017-03-23 NOTE — Progress Notes (Signed)
CHg wipes complete.

## 2017-03-23 NOTE — Progress Notes (Signed)
Orthopedic Tech Progress Note Patient Details:  Dasean Brow 04-29-1986 [id number]  Ortho Devices Type of Ortho Device: Post (long leg) splint, Stirrup splint Ortho Device/Splint Location: lle post long leg with long leg stirrups. Ortho Device/Splint Interventions: Ordered, Application, Adjustment   Trinna Post 03/23/2017, 12:39 AM

## 2017-03-23 NOTE — Progress Notes (Signed)
Orthopedic Tech Progress Note Patient Details:  Cody Fisher Oct 21, 1985 [id number]  Ortho Devices Type of Ortho Device: CAM walker Ortho Device/Splint Location: As requested by OR staff Large Cam walker provided to OR Nurse station.   Ortho Device/Splint Interventions: Ordered, Application, Adjustment   Alvina Chou 03/23/2017, 11:56 AM

## 2017-03-23 NOTE — Progress Notes (Signed)
RT in room for duration of sedation procedure.  Pt remained stable throughout procedure and monitored on EtCO2.

## 2017-03-23 NOTE — Progress Notes (Signed)
Ortho tech was consulted d/t pain at L heel. Ortho tech currently at bedside. Unable to relieve pressure pt feels since pt needing splint placement. Pt not wanting to redo splint at this time.

## 2017-03-23 NOTE — ED Notes (Signed)
[facility] contacted for transport to [facility] 

## 2017-03-23 NOTE — Plan of Care (Signed)
Problem: Physical Regulation: Goal: Ability to maintain clinical measurements within normal limits will improve Outcome: Progressing Comfort w/ pain medication

## 2017-03-23 NOTE — Progress Notes (Signed)
Patient transported to surgery, pre-procedure checklist complete, report called to short stay to [place], she is aware only able to complete one CHG bath. Patient fiance went down with him.

## 2017-03-23 NOTE — Evaluation (Signed)
Physical Therapy Evaluation Patient Details Name: Cody Fisher MRN: [id number] DOB: [birth year] Today's Date: 03/23/2017   History of Present Illness  Pt is a 31 y.o. male admitted on 03/22/17 after sustaining an injury playing soccer, resulting in a L tibia fx. Now s/p L tibial IM nail on 03/23/17. Pertinent PMH includes HTN, heart murmur, GERD.    Clinical Impression  Pt presents with pain and an overall decrease in functional mobility secondary to above. PTA, pt indep and lives at home with wife who will be available for 24/7 assist if needed. Educ on precautions, positioning, crutch training, and importance of mobility. Today, pt able to transfer and amb with crutches, progressing to supervision for balance; ascend/descended 2 steps with min guard. Feel pt would be safe to return home with crutches and assist from family. Will continue to follow acutely to maximize functional mobility and independence prior to d/c home with supervision.     Follow Up Recommendations No PT follow up;Supervision for mobility/OOB    Equipment Recommendations  Crutches    Recommendations for Other Services       Precautions / Restrictions Precautions Precautions: None Required Braces or Orthoses: Other Brace/Splint Other Brace/Splint: CAM walker boot Restrictions Weight Bearing Restrictions: Yes LLE Weight Bearing: Touchdown weight bearing Other Position/Activity Restrictions: TDWB in boot      Mobility  Bed Mobility Overal bed mobility: Modified Independent Bed Mobility: Supine to Sit;Sit to Supine     Supine to sit: Modified independent (Device/Increase time);HOB elevated Sit to supine: Modified independent (Device/Increase time);HOB elevated      Transfers Overall transfer level: Needs assistance Equipment used: Crutches;None Transfers: Sit to/from Stand Sit to Stand: Min guard;Supervision         General transfer comment: Initial standing with crutches and min guard for balance;  education on technique. Stood 2x more with supervision and good technique maintaining TDWB precautions  Ambulation/Gait Ambulation/Gait assistance: Min Control and instrumentation engineer (Feet): 150 Feet Assistive device: Crutches   Gait velocity: Decreased Gait velocity interpretation: <1.8 ft/sec, indicative of risk for recurrent falls General Gait Details: Amb with bilat axillary crutches using hop-through pattern to maintain LLE TDWB precautions; pt with good technique progressing from min guard to supervision for balance.   Stairs Stairs: Yes Stairs assistance: Min guard Stair Management: One rail Right;Step to pattern;Forwards;With crutches Number of Stairs: 2 General stair comments: Ascend/descended 2 steps with R-rail and LUE support on crutch; educ on technique and safety. Pt able to maintain precautions well. Will have assist at home for stairs if needed  Wheelchair Mobility    Modified Rankin (Stroke Patients Only)       Balance Overall balance assessment: Needs assistance Sitting-balance support: No upper extremity supported;Feet unsupported;Feet supported Sitting balance-Leahy Scale: Good     Standing balance support: No upper extremity supported;During functional activity;Bilateral upper extremity supported Standing balance-Leahy Scale: Fair Standing balance comment: Able to static stand with no UE support and min guard                             Pertinent Vitals/Pain Pain Assessment: Faces Faces Pain Scale: Hurts even more Pain Location: LLE Pain Descriptors / Indicators: Aching;Grimacing;Throbbing Pain Intervention(s): Monitored during session;Repositioned    Home Living Family/patient expects to be discharged to:: Private residence Living Arrangements: Spouse/significant other Available Help at Discharge: Family;Available 24 hours/day Type of Home: House Home Access: Stairs to enter Entrance Stairs-Rails: Right Entrance Stairs-Number  of Steps: 2 Home Layout:  One level Home Equipment: None      Prior Function Level of Independence: Independent               Hand Dominance        Extremity/Trunk Assessment   Upper Extremity Assessment Upper Extremity Assessment: Overall WFL for tasks assessed    Lower Extremity Assessment Lower Extremity Assessment: LLE deficits/detail LLE Deficits / Details: s/p L tibial IM nail; hip flexion grossly 3/5 LLE: Unable to fully assess due to pain       Communication   Communication: No difficulties  Cognition Arousal/Alertness: Awake/alert Behavior During Therapy: WFL for tasks assessed/performed Overall Cognitive Status: Within Functional Limits for tasks assessed                                        General Comments      Exercises     Assessment/Plan    PT Assessment Patient needs continued PT services  PT Problem List Decreased strength;Decreased range of motion;Decreased activity tolerance;Decreased balance;Decreased mobility;Decreased knowledge of use of DME;Pain       PT Treatment Interventions DME instruction;Gait training;Stair training;Functional mobility training;Therapeutic activities;Therapeutic exercise;Patient/family education    PT Goals (Current goals can be found in the Care Plan section)  Acute Rehab PT Goals Patient Stated Goal: Return home with less pain PT Goal Formulation: With patient Time For Goal Achievement: 04/06/17 Potential to Achieve Goals: Good    Frequency Min 5X/week   Barriers to discharge        Co-evaluation               AM-PAC PT "6 Clicks" Daily Activity  Outcome Measure Difficulty turning over in bed (including adjusting bedclothes, sheets and blankets)?: A Little Difficulty moving from lying on back to sitting on the side of the bed? : A Little Difficulty sitting down on and standing up from a chair with arms (e.g., wheelchair, bedside commode, etc,.)?: A Little Help needed moving  to and from a bed to chair (including a wheelchair)?: A Little Help needed walking in hospital room?: A Little Help needed climbing 3-5 steps with a railing? : A Little 6 Click Score: 18    End of Session Equipment Utilized During Treatment: Gait belt Activity Tolerance: Patient tolerated treatment well Patient left: in chair;with call bell/phone within reach;with family/visitor present Nurse Communication: Mobility status PT Visit Diagnosis: Other abnormalities of gait and mobility (R26.89);Pain Pain - Right/Left: Left Pain - part of body: Leg    Time: 1541-1606 PT Time Calculation (min) (ACUTE ONLY): 25 min   Charges:   PT Evaluation $PT Eval Low Complexity: 1 Low PT Treatments $Gait Training: 8-22 mins   PT G Codes:       Ina Homes, PT, DPT Acute Rehab Services  Pager: [phone]  Malachy Chamber 03/23/2017, 4:28 PM

## 2017-03-23 NOTE — Transfer of Care (Signed)
Immediate Anesthesia Transfer of Care Note  Patient: Cody Fisher  Procedure(s) Performed: INTRAMEDULLARY (IM) NAIL TIBIAL (Left Leg Lower)  Patient Location: PACU  Anesthesia Type:General  Level of Consciousness: awake and alert   Airway & Oxygen Therapy: Patient Spontanous Breathing and Patient connected to nasal cannula oxygen  Post-op Assessment: Report given to RN and Post -op Vital signs reviewed and stable  Post vital signs: Reviewed and stable  Last Vitals:  Vitals:   03/23/17 0236 03/23/17 1230  BP: 136/79   Pulse: 79   Resp: 17   Temp: 37.1 C 36.9 C  SpO2: 98%     Last Pain:  Vitals:   03/23/17 1230  TempSrc:   PainSc: 7          Complications: No apparent anesthesia complications

## 2017-03-23 NOTE — Interval H&P Note (Signed)
History and Physical Interval Note:  03/23/2017 9:45 AM  Cody Fisher  has presented today for surgery, with the diagnosis of left tibia fracture  The various methods of treatment have been discussed with the patient and family. After consideration of risks, benefits and other options for treatment, the patient has consented to  Procedure(s): INTRAMEDULLARY (IM) NAIL TIBIAL (Left) as a surgical intervention .  The patient's history has been reviewed, patient examined, no change in status, stable for surgery.  I have reviewed the patient's chart and labs.  Questions were answered to the patient's satisfaction.     Specific risks were discussed including non-union, infection, compartment syndrome and need for further surgery.  All questions answered.  [name]

## 2017-03-23 NOTE — ED Provider Notes (Signed)
Medical screening examination/treatment/procedure(s) were conducted as a shared visit with non-physician practitioner(s) and myself.  I personally evaluated the patient during the encounter.    Physical Exam  BP 124/69   Pulse 88   Temp 98.7 F (37.1 C) (Oral)   Resp 14   Ht  (1.778 m)   Wt 73.5 kg (162 lb)   SpO2 100%   BMI 23.24 kg/m   Physical Exam  Constitutional: He appears well-developed and well-nourished.  HENT:  Head: Normocephalic and atraumatic.  Eyes: Conjunctivae and EOM are normal.  Neck: Normal range of motion.  Cardiovascular: Normal rate.   Pulmonary/Chest: Effort normal. No respiratory distress.  Abdominal: Soft. He exhibits no distension.  Musculoskeletal: Normal range of motion. He exhibits tenderness (left middle lower leg) and deformity (left lower leg).  Neurological: He is alert.  Skin: Skin is warm and dry.  Nursing note and vitals reviewed.   ED Course  .Sedation Date/Time: 03/23/2017 12:56 AM Performed by: Marily Memos Authorized by: Marily Memos   Consent:    Consent obtained:  Verbal   Consent given by:  Patient   Risks discussed:  Allergic reaction, dysrhythmia, inadequate sedation, nausea, prolonged hypoxia resulting in organ damage, prolonged sedation necessitating reversal, respiratory compromise necessitating ventilatory assistance and intubation and vomiting   Alternatives discussed:  Analgesia without sedation, anxiolysis and regional anesthesia Universal protocol:    Procedure explained and questions answered to patient or proxy's satisfaction: yes     Relevant documents present and verified: yes     Test results available and properly labeled: yes     Imaging studies available: yes     Required blood products, implants, devices, and special equipment available: yes     Site/side marked: yes     Immediately prior to procedure a time out was called: yes     Patient identity confirmation method:  Verbally with  patient Indications:    Procedure necessitating sedation performed by:  Physician performing sedation   Intended level of sedation:  Deep Pre-sedation assessment:    Time since last food or drink:  3 hours   ASA classification: class 1 - normal, healthy patient     Neck mobility: normal     Mouth opening:  3 or more finger widths   Thyromental distance:  4 finger widths   Mallampati score:  I - soft palate, uvula, fauces, pillars visible   Pre-sedation assessments completed and reviewed: airway patency, cardiovascular function, hydration status, mental status, nausea/vomiting, pain level, respiratory function and temperature   Immediate pre-procedure details:    Reassessment: Patient reassessed immediately prior to procedure     Reviewed: vital signs, relevant labs/tests and NPO status     Verified: bag valve mask available, emergency equipment available, intubation equipment available, IV patency confirmed, oxygen available and suction available   Procedure details (see MAR for exact dosages):    Preoxygenation:  Nasal cannula   Sedation:  Propofol   Intra-procedure monitoring:  Blood pressure monitoring, cardiac monitor, continuous pulse oximetry, frequent LOC assessments, frequent vital sign checks and continuous capnometry   Intra-procedure events: none     Total Provider sedation time (minutes):  25 Post-procedure details:    Attendance: Constant attendance by certified staff until patient recovered     Recovery: Patient returned to pre-procedure baseline     Post-sedation assessments completed and reviewed: airway patency, cardiovascular function, hydration status, mental status, nausea/vomiting, pain level, respiratory function and temperature     Patient is stable for discharge  or admission: yes     Patient tolerance:  Tolerated well, no immediate complications .Splint Application Date/Time: 03/23/2017 12:58 AM Performed by: Marily Memos Authorized by: Marily Memos    Consent:    Consent obtained:  Verbal   Consent given by:  Patient   Risks discussed:  Discoloration, numbness and pain   Alternatives discussed:  No treatment Pre-procedure details:    Sensation:  Normal   Skin color:  Pink Procedure details:    Laterality:  Left   Location:  Leg   Leg:  L lower leg   Strapping: no     Splint type:  Long leg   Supplies:  Ortho-Glass Post-procedure details:    Pain:  Improved   Sensation:  Normal   Patient tolerance of procedure:  Tolerated well, no immediate complications    MDM Tib/fib fracture, reduced by PA. Sedation by myself. Splint was applied by [place] and ortho tech, NVI afterwards. Stable for transfer to [facility] for definitive repair.        Kostas Marrow, Barbara Cower, MD 03/23/17 0100

## 2017-03-23 NOTE — Op Note (Signed)
Orthopaedic Surgery Operative Note (CSN: [id number])  Cody Fisher  Jul 27, 1985 Date of Surgery: 03/22/2017 - 03/23/2017 Admit Date: 03/22/2017  Diagnoses:  left tibia fracture  Post-Op Diagnosis: Same  Procedures:   * INTRAMEDULLARY (IM) NAIL TIBIAL - 14782   Operative Finding Successful completion of planned procedure. Good fixation noted at all interlocking screws.  Post-operative plan: The patient will be TDWB in boot.  The patient will be admitted overnight for compartment monitoring and pain control.  DVT prophylaxis Aspirin  qd.  Pain control with PRN pain medication preferring oral medicines.  Follow up plan will be scheduled in approximately 10-14 days for wound check and AP/Lateral tibia films.  Surgeons:Primary: [name], MD Location: [facility] OR ROOM 03 Anesthesia: General Antibiotics: Ancef 2g preop Tourniquet time: * No tourniquets in log * Estimated Blood Loss: * No blood loss amount entered * Complications: None Specimens: None Implants:  Implant Name Type Inv. Item Serial No. Manufacturer Lot No. LRB No. Used Action  NAIL TIBIAL STD [id number] - [id number] Nail NAIL TIBIAL STD 9X345MM  STRYKER TRAUMA [id number] Left 1 Implanted  SCREW LOCKING T2 F/T  5X42. - [id number] Screw SCREW LOCKING T2 F/T  5X42.  STRYKER TRAUMA [id number] Left 1 Implanted  SCREW LOCKING T2 F/T  5MMX45MM - [id number] Screw SCREW LOCKING T2 F/T  5MMX45MM  STRYKER TRAUMA [id number] Left 1 Implanted  SCREW LOCKING T2 F/T  5X32.5MM - [id number] Screw SCREW LOCKING T2 F/T  5X32.5MM   STRYKER TRAUMA [id number] Left 1 Implanted    Indications for Surgery:   Cody Fisher is a 31 y.o. male with soccer related injury resulting in a closed tibial shaft fracture.  We discussed specific risks including non-union, need for revision surgery, infection and compartment syndrome.  Benefits and risks of operative and nonoperative management were discussed prior to surgery with patient/guardian(s) and informed consent  form was completed.     Procedure:   The patient was identified in the preoperative holding area where the surgical site was marked. The patient was taken to the OR where a procedural timeout was called and the above noted anesthesia was induced.  Preoperative antibiotics were dosed.  The patient's left leg was prepped and draped in the usual sterile fashion.  A second preoperative timeout was called.      We then made a central suprapatellar incision and dissected down to the level of the quadriceps tendon with scissors.  At this point a 4 cm longitudinal incision was made in line with the fibers of the quadriceps tendon.  We then bluntly digitally palpated through this arthrotomy and were able to feel the underside of the patella and the trochlea with abundant space for passage of the trocar for the Stryker SPI system for T2 nailing.  We placed the trocar and protective sleeve and  under fluoroscopy we confirmed our trajectory on both the AP and lateral imaging finding a starting point just medial to the lateral tibial eminence.  Once this was appropriately located we drilled the guidewire into the proximal tibia.  We then used two wires built for the system to affix the trocar to the tibia in standard fashion as is described in the technique guide.  At this time we used a entry reamer to create a hole for a ball-tipped guidewire to be inserted.  We then reduced the fracture manually under fluoroscopy and passed a ball-tipped guidewire down the length of the tibia holding the appropriate reduction with weber clamp.  The ball-tipped guidewire was placed  to the distal tibial physis scar.  Once it was placed appropriately and we again confirmed location on fluoroscopy we began reaming.  We sequentially reamed up to 10.5 mm.  We then measured and selected a 9 mm Stryker T2 tibial nail has an appropriate instrument.  This was passed over the guidewire while holding the tibia reduced.  At this point we attached  the outrigger for the proximal aspect of the tibia.  1 interlocks were placed under fluoroscopy proximally achieving good purchase.   2 distal interlocks were placed under perfect circle technique.  The wounds was thoroughly irrigated.  The wound was closed in a multilayer fashion.  A sterile dressing was placed.  The patient was awoken from general anesthesia and taken to the PACU in stable condition without complication.

## 2017-03-23 NOTE — Anesthesia Procedure Notes (Signed)
Procedure Name: Intubation Date/Time: 03/23/2017 10:46 AM Performed by: Rejeana Brock L Pre-anesthesia Checklist: Patient identified, Emergency Drugs available, Suction available and Patient being monitored Patient Re-evaluated:Patient Re-evaluated prior to induction Oxygen Delivery Method: Circle System Utilized Preoxygenation: Pre-oxygenation with 100% oxygen Induction Type: IV induction Ventilation: Mask ventilation without difficulty Laryngoscope Size: Mac and 4 Grade View: Grade II Tube type: Oral Tube size: 7.5 mm Number of attempts: 1 Airway Equipment and Method: Stylet and Oral airway Placement Confirmation: ETT inserted through vocal cords under direct vision,  positive ETCO2 and breath sounds checked- equal and bilateral Secured at: 22 cm Tube secured with: Tape Dental Injury: Teeth and Oropharynx as per pre-operative assessment

## 2017-03-24 ENCOUNTER — Inpatient Hospital Stay (HOSPITAL_COMMUNITY): Payer: 59

## 2017-03-24 DIAGNOSIS — S82202A Unspecified fracture of shaft of left tibia, initial encounter for closed fracture: Secondary | ICD-10-CM | POA: Diagnosis present

## 2017-03-24 MED ORDER — CHLORHEXIDINE GLUCONATE CLOTH 2 % EX PADS: 6.0000 | MEDICATED_PAD | Freq: Every day | CUTANEOUS | Status: DC

## 2017-03-24 MED ORDER — MUPIROCIN 2 % EX OINT: 1.0000 "application " | TOPICAL_OINTMENT | Freq: Two times a day (BID) | CUTANEOUS | Status: DC

## 2017-03-24 NOTE — Discharge Instructions (Signed)
°  Ramond Marrow MD, MPH [facility] [address] [phone] (tel)   [phone] (fax)   POST-OPERATIVE INSTRUCTIONS   WOUND CARE ? Ace wrap may be removed POD2, leave Aquacell on for 1 week. ? You may shower on Post-Op Day #2. Ok to get incisions wet but do not submerge them.  No ointments or lotions on incisions  EXERCISES ? No exercises.  Use boot except for hygiene and small breaks as we discussed. ? TDWB in boot  POST-OP MEDICINES ? A multi-modal approach will be used to treat your pain.  Oxycodone - This is a strong narcotic, to be used only on an as needed basis for pain.  Acetaminophen - A non-narcotic pain medicine.  Use  three times a day for the first 14 days after surgery    Zofran  - This is an anti-nausea medicine to be used only if you are having nausea or vomitting.  Aspirin  - This is a medicine used to help prevent blood clots.  Please use it daily.   ? If you have any adverse effects with the medications, please call our office.  FOLLOW-UP ? If you develop a Fever (?101.5), Redness or Drainage from the surgical incision site, please call our office to arrange for an evaluation. ? Please call the office to schedule a follow-up appointment for your suture removal, 10-14 days post-operatively.  IF YOU HAVE ANY QUESTIONS, PLEASE FEEL FREE TO CALL OUR OFFICE.  HELPFUL INFORMATION ? You should wean off your narcotic medicines as soon as you are able.  Most patients will be off or using minimal narcotics before their first postop appointment.    ? We suggest you use the pain medication the first night prior to going to bed, in order to ease any pain when the anesthesia wears off. You should avoid taking pain medications on an empty stomach as it will make you nauseous.  ? Do not drink alcoholic beverages or take illicit drugs when taking pain medications.  ? In most states it is against the law to drive while  you are in a splint or sling.  And certainly against the law to drive while taking narcotics.  ? You may return to work/school in the next couple of days when you feel up to it.   ? Pain medication may make you constipated.  Below are a few solutions to try in this order: - Decrease the amount of pain medication if you arent having pain. - Drink lots of decaffeinated fluids. - Drink prune juice and/or each dried prunes  o If the first 3 dont work start with additional solutions - Take Colace - an over-the-counter stool softener - Take Senokot - an over-the-counter laxative - Take Miralax - a stronger over-the-counter laxative

## 2017-03-24 NOTE — Progress Notes (Signed)
OT Note    03/24/17 1400  OT Visit Information  Last OT Received On 03/24/17  OT Time Calculation  OT Start Time (ACUTE ONLY) 1006  OT Stop Time (ACUTE ONLY) 1050  OT Time Calculation (min) 44 min  OT General Charges  $OT Visit 1 Visit  OT Evaluation  $OT Eval Low Complexity 1 Low  OT Treatments  $Self Care/Home Management  23-37 mins  [facility], OT/L  [phone] 03/24/2017

## 2017-03-24 NOTE — Anesthesia Postprocedure Evaluation (Signed)
Anesthesia Post Note  Patient: Cody Fisher  Procedure(s) Performed: INTRAMEDULLARY (IM) NAIL TIBIAL (Left Leg Lower)     Patient location during evaluation: PACU Anesthesia Type: General Level of consciousness: awake Pain management: pain level controlled Vital Signs Assessment: post-procedure vital signs reviewed and stable Respiratory status: spontaneous breathing Cardiovascular status: stable Postop Assessment: no apparent nausea or vomiting Anesthetic complications: no    Last Vitals:  Vitals:   03/23/17 2130 03/24/17 0609  BP: 137/63 125/79  Pulse: 80 64  Resp: 16 16  Temp: 37.1 C 37.1 C  SpO2: 99% 100%    Last Pain:  Vitals:   03/24/17 0609  TempSrc: Oral  PainSc:    Pain Goal: Patients Stated Pain Goal: 3 (03/23/17 2021)               Daysie Helf JR,JOHN Susann Givens

## 2017-03-24 NOTE — Progress Notes (Signed)
Physical Therapy Treatment Patient Details Name: Cody Fisher MRN: [id number] DOB: 05-Sep-1985 Today's Date: 03/24/2017    History of Present Illness Pt is a 31 y.o. male admitted on 03/22/17 after sustaining an injury playing soccer, resulting in a L tibia fx. Now s/p L tibial IM nail on 03/23/17. Pertinent PMH includes HTN, heart murmur, GERD.   PT Comments    Pt has progressed well with mobility. Now mod indep with crutches for transfers and amb 250'; supervision for safety with stairs, which pt will have from fiance at home. Reviewed positioning and therex, with HEP handout provided. Pt has met short-term acute PT goals. All education completed and pt has no further questions. Discussed potential for pursuing treatment at outpatient PT for return-to-sport activities in the future. D/c acute PT.   Follow Up Recommendations  No PT follow up;Supervision for mobility/OOB     Equipment Recommendations  Crutches    Recommendations for Other Services       Precautions / Restrictions Precautions Precautions: None Required Braces or Orthoses: Other Brace/Splint Other Brace/Splint: CAM walker boot Restrictions Weight Bearing Restrictions: Yes LLE Weight Bearing: Touchdown weight bearing Other Position/Activity Restrictions: TDWB in boot    Mobility  Bed Mobility Overal bed mobility: Modified Independent Bed Mobility: Supine to Sit              Transfers Overall transfer level: Modified independent Equipment used: Crutches Transfers: Sit to/from Stand              Ambulation/Gait Ambulation/Gait assistance: Modified independent (Device/Increase time) Ambulation Distance (Feet): 250 Feet Assistive device: Crutches   Gait velocity: Decreased   General Gait Details: Good hop-through technique with crutches and ability to maintain LLE precautions   Stairs Stairs: Yes   Stair Management: One rail Right;Step to pattern;Forwards;With crutches Number of Stairs: 2    Wheelchair Mobility    Modified Rankin (Stroke Patients Only)       Balance Overall balance assessment: Needs assistance Sitting-balance support: No upper extremity supported;Feet unsupported;Feet supported Sitting balance-Leahy Scale: Good     Standing balance support: No upper extremity supported;During functional activity;Bilateral upper extremity supported Standing balance-Leahy Scale: Fair Standing balance comment: Able to static stand with no UE support and min guard                            Cognition Arousal/Alertness: Awake/alert Behavior During Therapy: WFL for tasks assessed/performed Overall Cognitive Status: Within Functional Limits for tasks assessed                                        Exercises General Exercises - Lower Extremity Quad Sets: AROM;Left;5 reps;Seated Short Arc Quad: AROM;Left;5 reps;Seated Long Arc Quad: AROM;Left;5 reps;Seated Straight Leg Raises: AAROM;Left;5 reps;Seated    General Comments        Pertinent Vitals/Pain Pain Assessment: Faces Faces Pain Scale: Hurts little more Pain Location: LLE Pain Descriptors / Indicators: Discomfort;Sore Pain Intervention(s): Monitored during session;Repositioned    Home Living                      Prior Function            PT Goals (current goals can now be found in the care plan section) Acute Rehab PT Goals Patient Stated Goal: Return home with less pain PT Goal Formulation: With patient Time  For Goal Achievement: 04/06/17 Potential to Achieve Goals: Good Progress towards PT goals: Goals met/education completed, patient discharged from PT    Frequency    Min 5X/week      PT Plan Current plan remains appropriate    Co-evaluation              AM-PAC PT "6 Clicks" Daily Activity  Outcome Measure  Difficulty turning over in bed (including adjusting bedclothes, sheets and blankets)?: None Difficulty moving from lying on back to  sitting on the side of the bed? : None Difficulty sitting down on and standing up from a chair with arms (e.g., wheelchair, bedside commode, etc,.)?: None Help needed moving to and from a bed to chair (including a wheelchair)?: None Help needed walking in hospital room?: None Help needed climbing 3-5 steps with a railing? : A Little 6 Click Score: 23    End of Session Equipment Utilized During Treatment: Gait belt Activity Tolerance: Patient tolerated treatment well Patient left: in chair;with call bell/phone within reach Nurse Communication: Mobility status PT Visit Diagnosis: Other abnormalities of gait and mobility (R26.89);Pain Pain - Right/Left: Left Pain - part of body: Leg     Time: 0720-0750 PT Time Calculation (min) (ACUTE ONLY): 30 min  Charges:  $Gait Training: 8-22 mins $Therapeutic Exercise: 8-22 mins                    G Codes:      Mabeline Caras, PT, DPT Acute Rehab Services  Pager: [place] 03/24/2017, 8:02 AM

## 2017-03-24 NOTE — Therapy (Signed)
Occupational Therapy Evaluation and Discharge Patient Details Name: Cody Fisher MRN: [id number] DOB: 01-Aug-1985 Today's Date: 03/24/2017    History of Present Illness Pt is a 31 y.o. male admitted on 03/22/17 after sustaining an injury playing soccer, resulting in a L tibia fx. Now s/p L tibial IM nail on 03/23/17. Pertinent PMH includes HTN, heart murmur, GERD.   Clinical Impression   Pt reports being independent in ADLs, IADLs, and works full time PTA. Currently pt requires supervision for functional mobility, tub shower transfer, and LB ADLs using crutches due to TDWB on LLE. Pt reports that he lives with his fiance who is available to assist as needed upon d/c. Pt completed tub shower transfer with supervision for safety and was able to maintain TDWB status. Pt advised to elevate and ice LLE to reduce swelling. Pt has completed all acute OT education and is safe to d/c home with intermittent supervision. No further acute OT needs at this time. OT signing off.     Follow Up Recommendations  No OT follow up;Supervision - Intermittent    Equipment Recommendations  3 in 1 bedside commode    Recommendations for Other Services       Precautions / Restrictions Precautions Precautions: None Required Braces or Orthoses: Other Brace/Splint Other Brace/Splint: CAM walker boot Restrictions Weight Bearing Restrictions: Yes LLE Weight Bearing: Touchdown weight bearing Other Position/Activity Restrictions: TDWB in boot      Mobility Bed Mobility Overal bed mobility: Modified Independent Bed Mobility: Supine to Sit;Sit to Supine     Supine to sit: Modified independent (Device/Increase time);HOB elevated Sit to supine: Modified independent (Device/Increase time);HOB elevated      Transfers Overall transfer level: Needs assistance Equipment used: Crutches Transfers: Sit to/from Stand Sit to Stand: Min guard              Balance Overall balance assessment: Needs  assistance Sitting-balance support: No upper extremity supported;Feet unsupported;Feet supported Sitting balance-Leahy Scale: Good     Standing balance support: Bilateral upper extremity supported;During functional activity Standing balance-Leahy Scale: Fair Standing balance comment: Bilateral UE support during functional mobility.                            ADL either performed or assessed with clinical judgement   ADL Overall ADL's : Needs assistance/impaired     Grooming: Modified independent;Standing   Upper Body Bathing: Modified independent;Sitting   Lower Body Bathing: Supervison/ safety;Set up;Sit to/from stand   Upper Body Dressing : Modified independent   Lower Body Dressing: Supervision/safety;Set up;Sit to/from stand Lower Body Dressing Details (indicate cue type and reason): Pt educated on compensatory techniques for LB dressing. Pt educated on wrap technique to decrease swelling in LE. Pt able to independently don/doff brace.  Toilet Transfer: Supervision/safety;Ambulation (3in1, crutches)   Toileting- Clothing Manipulation and Hygiene: Modified independent   Tub/ Shower Transfer: Supervision/safety;Set up;Ambulation;3 in 1 (Crutches ) Tub/Shower Transfer Details (indicate cue type and reason): pt able to complete tub shower transfer using compensatory techniques and a 3in1. Pt advised to have fiance available to assist initially upon d/c.  Functional mobility during ADLs: Supervision/safety Information systems manager )       Vision         Perception     Praxis      Pertinent Vitals/Pain Pain Assessment: Faces Pain Score: 4  Faces Pain Scale: Hurts little more Pain Location: LLE Pain Descriptors / Indicators: Discomfort;Sore Pain Intervention(s): Monitored during session  Hand Dominance     Extremity/Trunk Assessment Upper Extremity Assessment Upper Extremity Assessment: Overall WFL for tasks assessed   Lower Extremity Assessment Lower Extremity  Assessment: Defer to PT evaluation   Cervical / Trunk Assessment Cervical / Trunk Assessment: Normal   Communication Communication Communication: No difficulties   Cognition Arousal/Alertness: Awake/alert Behavior During Therapy: WFL for tasks assessed/performed Overall Cognitive Status: Within Functional Limits for tasks assessed                                     General Comments  Pt educated on the importance of elevation and ice to reduce swelling in his LLE, even while at work. Pt reports he is getting married in two weeks.      Exercises     Shoulder Instructions      Home Living Family/patient expects to be discharged to:: Private residence Living Arrangements: Spouse/significant other Available Help at Discharge: Family;Available 24 hours/day Type of Home: House Home Access: Stairs to enter [employer] of Steps: 2 Entrance Stairs-Rails: Right Home Layout: One level     Bathroom Shower/Tub: Chief Strategy Officer: Standard     Home Equipment: None          Prior Functioning/Environment Level of Independence: Independent        Comments: Pt works full time in [employer]ing [employer].         OT Problem List: Impaired balance (sitting and/or standing);Decreased safety awareness;Decreased knowledge of use of DME or AE      OT Treatment/Interventions:      OT Goals(Current goals can be found in the care plan section) Acute Rehab OT Goals Patient Stated Goal: To go home  OT Goal Formulation: All assessment and education complete, DC therapy  OT Frequency:     Barriers to D/C:            Co-evaluation              AM-PAC PT "6 Clicks" Daily Activity     Outcome Measure Help from another person eating meals?: None Help from another person taking care of personal grooming?: None Help from another person toileting, which includes using toliet, bedpan, or urinal?: None Help from another person bathing (including washing,  rinsing, drying)?: None Help from another person to put on and taking off regular upper body clothing?: None Help from another person to put on and taking off regular lower body clothing?: None 6 Click Score: 24   End of Session Equipment Utilized During Treatment: Gait belt Information systems manager ) Nurse Communication: Mobility status  Activity Tolerance: Patient tolerated treatment well Patient left: in bed;with call bell/phone within reach  OT Visit Diagnosis: Other abnormalities of gait and mobility (R26.89)                Time: 1006-1050 OT Time Calculation (min): 44 min Charges:    G-Codes:     Cammy Copa, OTS [phone]   Cammy Copa 03/24/2017, 1:25 PM

## 2017-03-24 NOTE — Discharge Summary (Signed)
Physician Discharge Summary  Patient ID: Cody Fisher MRN: [id number] DOB/AGE: 11/25/1985 31 y.o.  Admit date: 03/22/2017 Discharge date: 03/24/2017  Admission Diagnoses:  Discharge Diagnoses:  Active Problems:   Fracture of tibial shaft, left, closed   Discharged Condition: good  Hospital Course: Patient admitted from ER for surgery.  Tolerated procedure well.  Was kept for monitoring overnight for pain control and medical monitoring postop and was found to be stable for DC home the morning after surgery.  Patient was instructed on specific activity restrictions and all questions were answered.   Consults: None  Significant Diagnostic Studies: XR  Treatments: surgery: Left Tibia IM Nail  Discharge Exam: Blood pressure 125/79, pulse 64, temperature 98.7 F (37.1 C), temperature source Oral, resp. rate 16, height  (1.778 m), weight 73.2 kg (161 lb 6 oz), SpO2 100 %. General appearance: cooperative  WGN:FAOZ CDI, +EHL though remainder of motor difficult to test due to boot, sensation intact distally with warm well perfused foot, no pain w passive stretch   Disposition: 01-Home or Self Care  Discharge Instructions    Call MD for:  persistant nausea and vomiting    Complete by:  As directed    Call MD for:  redness, tenderness, or signs of infection (pain, swelling, redness, odor or green/yellow discharge around incision site)    Complete by:  As directed    Call MD for:  severe uncontrolled pain    Complete by:  As directed    Diet - low sodium heart healthy    Complete by:  As directed    Increase activity slowly    Complete by:  As directed      Allergies as of 03/24/2017   No Known Allergies     Medication List    TAKE these medications   acetaminophen 500 MG tablet Commonly known as:  TYLENOL Take 2 tablets (1,000 mg total) by mouth every 8 (eight) hours.   amLODipine 5 MG tablet Commonly known as:  NORVASC TAKE 1 TABLET BY MOUTH DAILY   aspirin 81  MG tablet Take 1 tablet (81 mg total) by mouth daily. What changed:  medication strength  how much to take  when to take this  reasons to take this   bisacodyl 5 MG EC tablet Commonly known as:  DULCOLAX Take 1 tablet (5 mg total) by mouth daily as needed for moderate constipation.   Fish Oil 1000 MG Caps Take 1 capsule by mouth daily.   multivitamin with minerals tablet Take 1 tablet by mouth daily.   omeprazole 40 MG capsule Commonly known as:  PRILOSEC Take 40 mg by mouth daily.   ondansetron 4 MG tablet Commonly known as:  ZOFRAN Take 1 tablet (4 mg total) by mouth every 8 (eight) hours as needed for nausea or vomiting.   OVER THE COUNTER MEDICATION Place 15 mg under the tongue daily.   oxyCODONE 5 MG immediate release tablet Commonly known as:  Oxy IR/ROXICODONE Take 1-2 pills every 6 hrs as needed for pain   TUMS ULTRA PO Take by mouth as needed.            Durable Medical Equipment        Start     Ordered   03/23/17 1622  For home use only DME Crutches  Once     03/23/17 1621     Follow-up Information    [name], MD. Schedule an appointment as soon as possible for a visit in  14 day(s).   Specialty:  Orthopedic Surgery Why:  About 10-14 days postop  Contact information: [address] [place] [address] [phone]           Signed: [name] 03/24/2017, 6:56 AM

## 2017-03-25 ENCOUNTER — Encounter (HOSPITAL_COMMUNITY): Payer: Self-pay | Admitting: Orthopaedic Surgery

## 2017-03-25 NOTE — Progress Notes (Signed)
OT Note - Addendum    Mar 30, 2017 1200  OT Visit Information  Last OT Received On 03-30-17  OT G-codes **NOT FOR INPATIENT CLASS**  Functional Assessment Tool Used Clinical judgement  Functional Limitation Self care  Self Care Current Status [phone]) CI  Self Care Goal Status ([id number]) CI  Self Care Discharge Status [phone]) CI  [facility], OT/L  [phone] 30-Mar-2017

## 2017-04-18 ENCOUNTER — Encounter (HOSPITAL_COMMUNITY): Payer: Self-pay | Admitting: Emergency Medicine

## 2017-04-18 ENCOUNTER — Other Ambulatory Visit: Payer: Self-pay

## 2017-04-18 ENCOUNTER — Other Ambulatory Visit: Payer: Self-pay | Admitting: Physician Assistant

## 2017-04-18 ENCOUNTER — Other Ambulatory Visit (HOSPITAL_COMMUNITY): Payer: Self-pay

## 2017-04-18 ENCOUNTER — Emergency Department (HOSPITAL_COMMUNITY): Payer: 59

## 2017-04-18 ENCOUNTER — Observation Stay (HOSPITAL_COMMUNITY)
Admission: EM | Admit: 2017-04-18 | Discharge: 2017-04-19 | Disposition: A | Discharge status: Home / Self Care | Payer: 59 | Attending: Internal Medicine | Admitting: Internal Medicine

## 2017-04-18 ENCOUNTER — Ambulatory Visit (HOSPITAL_COMMUNITY)
Admission: RE | Admit: 2017-04-18 | Discharge: 2017-04-18 | Disposition: A | Discharge status: Home / Self Care | Payer: 59 | Source: Ambulatory Visit | Attending: Physician Assistant | Admitting: Physician Assistant

## 2017-04-18 DIAGNOSIS — Z791 Long term (current) use of non-steroidal anti-inflammatories (NSAID): Secondary | ICD-10-CM | POA: Insufficient documentation

## 2017-04-18 DIAGNOSIS — Z720 Tobacco use: Secondary | ICD-10-CM | POA: Diagnosis not present

## 2017-04-18 DIAGNOSIS — Z79891 Long term (current) use of opiate analgesic: Secondary | ICD-10-CM | POA: Insufficient documentation

## 2017-04-18 DIAGNOSIS — M79605 Pain in left leg: Secondary | ICD-10-CM | POA: Diagnosis present

## 2017-04-18 DIAGNOSIS — I2699 Other pulmonary embolism without acute cor pulmonale: Secondary | ICD-10-CM | POA: Diagnosis not present

## 2017-04-18 DIAGNOSIS — Z79899 Other long term (current) drug therapy: Secondary | ICD-10-CM | POA: Insufficient documentation

## 2017-04-18 DIAGNOSIS — I10 Essential (primary) hypertension: Secondary | ICD-10-CM | POA: Diagnosis not present

## 2017-04-18 DIAGNOSIS — R079 Chest pain, unspecified: Secondary | ICD-10-CM | POA: Diagnosis present

## 2017-04-18 DIAGNOSIS — K219 Gastro-esophageal reflux disease without esophagitis: Secondary | ICD-10-CM | POA: Diagnosis not present

## 2017-04-18 DIAGNOSIS — Z7982 Long term (current) use of aspirin: Secondary | ICD-10-CM | POA: Insufficient documentation

## 2017-04-18 DIAGNOSIS — R072 Precordial pain: Secondary | ICD-10-CM | POA: Diagnosis not present

## 2017-04-18 DIAGNOSIS — I82402 Acute embolism and thrombosis of unspecified deep veins of left lower extremity: Secondary | ICD-10-CM | POA: Insufficient documentation

## 2017-04-18 DIAGNOSIS — F419 Anxiety disorder, unspecified: Secondary | ICD-10-CM | POA: Insufficient documentation

## 2017-04-18 DIAGNOSIS — G8929 Other chronic pain: Secondary | ICD-10-CM | POA: Diagnosis not present

## 2017-04-18 DIAGNOSIS — I82442 Acute embolism and thrombosis of left tibial vein: Secondary | ICD-10-CM | POA: Insufficient documentation

## 2017-04-18 DIAGNOSIS — S82202A Unspecified fracture of shaft of left tibia, initial encounter for closed fracture: Secondary | ICD-10-CM | POA: Diagnosis present

## 2017-04-18 LAB — BASIC METABOLIC PANEL WITH GFR
Anion gap: 10 (ref 5–15)
BUN: 10 mg/dL (ref 6–20)
CO2: 21 mmol/L — ABNORMAL LOW (ref 22–32)
Calcium: 9.9 mg/dL (ref 8.9–10.3)
Chloride: 102 mmol/L (ref 101–111)
Creatinine, Ser: 0.84 mg/dL (ref 0.61–1.24)
GFR calc Af Amer: >60 mL/min
GFR calc non Af Amer: >60 mL/min
Glucose, Bld: 100 mg/dL — ABNORMAL HIGH (ref 65–99)
Potassium: 3.2 mmol/L — ABNORMAL LOW (ref 3.5–5.1)
Sodium: 133 mmol/L — ABNORMAL LOW (ref 135–145)

## 2017-04-18 LAB — CBC
HEMATOCRIT: 40.4 % (ref 39.0–52.0)
Hemoglobin: 13.6 g/dL (ref 13.0–17.0)
MCH: 27.9 pg (ref 26.0–34.0)
MCHC: 33.7 g/dL (ref 30.0–36.0)
MCV: 82.8 fL (ref 78.0–100.0)
PLATELETS: 291 10*3/uL (ref 150–400)
RBC: 4.88 MIL/uL (ref 4.22–5.81)
RDW: 13 % (ref 11.5–15.5)
WBC: 7.9 10*3/uL (ref 4.0–10.5)

## 2017-04-18 LAB — I-STAT TROPONIN, ED
Troponin i, poc: 0 ng/mL (ref 0.00–0.08)
Troponin i, poc: 0.01 ng/mL (ref 0.00–0.08)

## 2017-04-18 MED ORDER — POTASSIUM CHLORIDE CRYS ER 20 MEQ PO TBCR
40.0000 meq | EXTENDED_RELEASE_TABLET | Freq: Once | ORAL | Status: AC
  Administered 2017-04-18: 40 meq via ORAL
  Filled 2017-04-18: qty 2

## 2017-04-18 MED ORDER — IOPAMIDOL (ISOVUE-370) INJECTION 76%
INTRAVENOUS | Status: AC
  Administered 2017-04-18: 100 mL
  Filled 2017-04-18: qty 100

## 2017-04-18 MED ORDER — SODIUM CHLORIDE 0.9 % IV BOLUS (SEPSIS)
1000.0000 mL | Freq: Once | INTRAVENOUS | Status: AC
  Administered 2017-04-18: 1000 mL via INTRAVENOUS

## 2017-04-18 MED ORDER — LORAZEPAM 1 MG PO TABS
1.0000 mg | ORAL_TABLET | Freq: Once | ORAL | Status: AC
  Administered 2017-04-18: 1 mg via ORAL
  Filled 2017-04-18: qty 1

## 2017-04-18 NOTE — ED Triage Notes (Signed)
Pt brought by EMS. Broke left tib/fib October. Diagnosed today with a blood clot in left leg. Started on Xarelto today. Took first dose today, and was told to come to ER if he experienced any chest pain. Pt states "you should know I have anxiety and chronic left sided chest pain and it feel the same but they told me to come here for any chest pain." Pt states he is anxious currently. Denies any acute pain with breathing or taking deep breaths.

## 2017-04-18 NOTE — Progress Notes (Signed)
Left lower extremity venous duplex has been completed. Evidence of acute deep vein thrombosis involving the posterior tibial and peroneal veins of the left lower extremity. [facility].  04/18/17 12:40 PM Olen CordialGreg Marciel Offenberger RVT

## 2017-04-18 NOTE — H&P (Signed)
History and Physical    Cody Fisher ZOX:[id number] DOB: 04-Jan-1986 DOA: 04/18/2017  PCP: Cain Saupe, MD (Inactive) Seeing a new PCP now Patient coming from: Home  Chief Complaint: Chest pain  HPI: Cody Fisher is a 31 y.o. male with medical history significant of HTN, GERD, chronic chest pain and anxiety who presented for chest pain.  He reports that he has been experiencing leg pain since last Thursday.  He had a fracture of the tib/fib in October and had surgery to correct.  Since that time he has been more sedentary and using crutches.  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  He had an ultrasound of his leg this morning and a DVT was found.  He was started on Xarelto today and told to return to the hospital if he developed chest pain.  After taking his first dose of Xarelto he developed left sided chest pain and anxiety and presented to the ED.  He did not have any SOB, or pain with breathing.  The chest pain resolved with some ativan in the ED.  CT chest did show a subsegmental PE without any right heart strain.  He reports that the chest pain felt very similar to anxiety he has had in the past.  He is not currently treated for this.    ED Course: In the ED he was found to have a PE on CT chest.  He had 2 troponin which were negative.  EKG showed Q in lead III and TWI in lead III, but no S in lead I.  He had a mildly low K at 3.2.    Review of Systems: As per HPI otherwise 10 point review of systems negative.   Past Medical History:  Diagnosis Date  . Chest pain   . Essential hypertension 06/03/2013  . GERD (gastroesophageal reflux disease)   . Heart murmur    As a Child  . Hypertension    Borderline    Past Surgical History:  Procedure Laterality Date  . TIBIA IM NAIL INSERTION Left 03/23/2017   Procedure: INTRAMEDULLARY (IM) NAIL TIBIAL;  Surgeon: [name], MD;  Location: [facility] OR;  Service: Orthopedics;  Laterality: Left;   Reviewed.  He is only  smoking cigars occasionally, not a daily smoker.   reports that he has been smoking.  He has never used smokeless tobacco. He reports that he drinks alcohol. He reports that he does not use drugs.  No Known Allergies  Reviewed.  He reports DM in his father.  No history of clots, unprovoked Family History  Problem Relation Age of Onset  . Diabetes Unknown        family history  . Hypertension Mother   . Diabetes Father     Prior to Admission medications   Medication Sig Start Date End Date Taking? Authorizing Provider  acetaminophen (TYLENOL) 500 MG tablet Take 500 mg by mouth every 6 (six) hours as needed for headache (pain).   Yes [provider]  amLODipine (NORVASC) 5 MG tablet TAKE 1 TABLET BY MOUTH DAILY Patient taking differently: TAKE 1 TABLET (5 MG) BY MOUTH DAILY 10/16/16  Yes Lars Masson, MD  aspirin 81 MG tablet Take 1 tablet (81 mg total) by mouth daily. Patient taking differently: Take 81 mg by mouth daily as needed (chest pain).  03/23/17 05/07/17 Yes [name], MD  calcium elemental as carbonate (TUMS ULTRA 1000) 400 MG chewable tablet Chew  1,000 mg by mouth 2 (two) times daily as needed for heartburn (indigestion).   Yes [provider]  Multiple Vitamins-Minerals (MULTIVITAMIN WITH MINERALS) tablet Take 1 tablet by mouth daily.   Yes [provider]  Omega-3 Fatty Acids (FISH OIL) 1000 MG CAPS Take 1,000 mg by mouth daily.    Yes [provider]  omeprazole (PRILOSEC) 40 MG capsule Take 40 mg by mouth daily.   Yes [provider]  OVER THE COUNTER MEDICATION Place 15 mg under the tongue daily. CBD oil   Yes [provider]  oxyCODONE (OXY IR/ROXICODONE) 5 MG immediate release tablet Take 5 mg by mouth every 6 (six) hours as needed (pain).  03/24/17  Yes [provider]  Rivaroxaban (XARELTO) 15 MG TABS tablet Take 15 mg by mouth See admin instructions. Take 1 tablet (15 mg) by mouth twice daily for 21  days (start date 04/18/17   Yes [provider]    Physical Exam: Vitals:   04/18/17 2215 04/18/17 2230 04/18/17 2245 04/18/17 2300  BP: 127/73 124/76 135/87 123/77  Pulse: 69 74 82 74  Resp: 18 (!) 24 11 15   Temp:      SpO2: 100% 99% 100% 100%    Constitutional: NAD, calm, comfortable Vitals:   04/18/17 2215 04/18/17 2230 04/18/17 2245 04/18/17 2300  BP: 127/73 124/76 135/87 123/77  Pulse: 69 74 82 74  Resp: 18 (!) 24 11 15   Temp:      SpO2: 100% 99% 100% 100%   Eyes:  lids and conjunctivae normal ENMT: Mucous membranes are moist.  Neck: normal, supple, no masses, no thyromegaly Respiratory: clear to auscultation bilaterally, no wheezing, no crackles. Normal respiratory effort. Cardiovascular: Regular rate and normal rhythm, no murmurs / rubs / gallops. Mild edema of the left leg, no pitting, mainly at the ankles.  No calf tenderness.  Abdomen: no tenderness.   Bowel sounds positive.  Musculoskeletal: no clubbing / cyanosis.  Normal muscle tone.  Skin: no rashes, lesions, ulcers. Small surgical scars on the left lower leg.  Neurologic: Grossly intact, moving all extremities.  Psychiatric: Normal judgment and insight. Alert and oriented x 3. Normal mood.    Labs on Admission: I have personally reviewed following labs and imaging studies  CBC:  Recent Labs Lab 04/18/17 1842  WBC 7.9  HGB 13.6  HCT 40.4  MCV 82.8  PLT 291   Basic Metabolic Panel:  Recent Labs Lab 04/18/17 1842  NA 133*  K 3.2*  CL 102  CO2 21*  GLUCOSE 100*  BUN 10  CREATININE 0.84  CALCIUM 9.9   GFR: CrCl cannot be calculated (Unknown ideal weight.). Liver Function Tests: No results for input(s): AST, ALT, ALKPHOS, BILITOT, PROT, ALBUMIN in the last 168 hours. No results for input(s): LIPASE, AMYLASE in the last 168 hours. No results for input(s): AMMONIA in the last 168 hours. Coagulation Profile: No results for input(s): INR, PROTIME in the last 168 hours. Cardiac  Enzymes: No results for input(s): CKTOTAL, CKMB, CKMBINDEX, TROPONINI in the last 168 hours. BNP (last 3 results) No results for input(s): PROBNP in the last 8760 hours. HbA1C: No results for input(s): HGBA1C in the last 72 hours. CBG: No results for input(s): GLUCAP in the last 168 hours. Lipid Profile: No results for input(s): CHOL, HDL, LDLCALC, TRIG, CHOLHDL, LDLDIRECT in the last 72 hours. Thyroid Function Tests: No results for input(s): TSH, T4TOTAL, FREET4, T3FREE, THYROIDAB in the last 72 hours. Anemia Panel: No results for input(s):  VITAMINB12, FOLATE, FERRITIN, TIBC, IRON, RETICCTPCT in the last 72 hours. Urine analysis: No results found for: COLORURINE, APPEARANCEUR, LABSPEC, PHURINE, GLUCOSEU, HGBUR, BILIRUBINUR, KETONESUR, PROTEINUR, UROBILINOGEN, NITRITE, LEUKOCYTESUR  Radiological Exams on Admission: Dg Chest 2 View  Result Date: 04/18/2017 CLINICAL DATA:  31 year old male with chest pain. History of left lower extremity DVT. EXAM: CHEST  2 VIEW COMPARISON:  01/24/2015 FINDINGS: The heart size and mediastinal contours are within normal limits. Both lungs are clear. The visualized skeletal structures are unremarkable. IMPRESSION: No radiographic findings of cardiopulmonary disease. Electronically Signed   By: Sande BrothersSerena  Chacko M.D.   On: 04/18/2017 18:09   Ct Angio Chest Pe W Or Wo Contrast  Result Date: 04/18/2017 CLINICAL DATA:  31 year old male with concern for pulmonary embolism. EXAM: CT ANGIOGRAPHY CHEST WITH CONTRAST TECHNIQUE: Multidetector CT imaging of the chest was performed using the standard protocol during bolus administration of intravenous contrast. Multiplanar CT image reconstructions and MIPs were obtained to evaluate the vascular anatomy. CONTRAST:  100 cc Isovue 370 COMPARISON:  Chest radiograph dated 04/18/2017 FINDINGS: Cardiovascular: There is no cardiomegaly or pericardial effusion. The thoracic aorta is unremarkable. There is pulmonary artery embolus  involving the left lower lobe subsegmental branch point (series 7, image 175). There is no CT evidence of right cardiac straining. Mediastinum/Nodes: No hilar or mediastinal adenopathy. Esophagus and the thyroid gland are grossly unremarkable. Lungs/Pleura: The lungs are clear. There is no pleural effusion or pneumothorax. The central airways are patent. Upper Abdomen: No acute abnormality. Musculoskeletal: No chest wall abnormality. No acute or significant osseous findings. Review of the MIP images confirms the above findings. IMPRESSION: Small left lower lobe subsegmental pulmonary artery embolus. No CT evidence of right heart straining. These results were called by telephone at the time of interpretation on 04/18/2017 at 10:35 pm to Dr. Shaune PollackAMERON ISAACS , who verbally acknowledged these results. Electronically Signed   By: Elgie CollardArash  Radparvar M.D.   On: 04/18/2017 22:41    EKG: Independently reviewed. NSR, Q in lead 3, inverted TW in lead 3  Assessment/Plan Pulmonary embolus and DVT 2/2 h/o Fracture of tibial shaft, left, closed - I do not think this new chest pain or PE is a sign of xarelto failure, most likely the PE was concominant with the DVT.  - Likely provoked given recent lower leg fracture (of same leg) along with newly sedentary lifestyle at work given surgery.  He has no FH of unprovoked clots and this is his first clot.  - Continue Xarelto - Monitor on telemetry overnight (pulse ox normal, no tachycardia, no right heart strain on CT scan of the chest) - If chest pain recurs, repeat EKG and troponin, consider monitoring TTE for right heart strain - One further troponin with AM labs.  - Repeat EKG in the AM - Oxycodone and ibuprofen for pain - Telemetry    Chest pain - Likely related to known history of anxiety - He will follow up with PCP to start an SSRI for anxiety and chest pain    Essential hypertension - Continue home amlodipine  GERD - Protonix per formulary, continue  omeprazole on discharge       DVT prophylaxis: Xarelto Code Status: Full Disposition Plan: Monitor overnight, likely able to go home in the AM Consults called: None Admission status: Telemetry, obs   Debe CoderMULLEN, EMILY MD [facility] Pager [phone]  If 7PM-7AM, please contact night-coverage [email or web address] Password [email or web address]  04/18/2017, 11:33 PM

## 2017-04-18 NOTE — ED Provider Notes (Addendum)
[facility] Provider Note   CSN: [id number] Arrival date & time: 04/18/17  1728     History   Chief Complaint Chief Complaint  Patient presents with  . Chest Pain    HPI Cody Fisher is a 31 y.o. male.  HPI   31 year old male with history of left tib-fib fracture status post operative repair, now weightbearing, who presents with DVT of left lower extremity and chest pain.  The patient was just diagnosed with a DVT earlier today.  He was started on Xarelto, which she has taken as prescribed.  He states that he has a history of anxiety and chronic chest pain.  He returned home and began reading about his DVT then developed acute onset of chest pain.  He describes the pain as a central, substernal, occasionally sharp but mostly dull pressure.  He denies any associated shortness of breath.  No cough or hemoptysis.  He has not had any dyspnea with exertion.  The pain is worse with movement and position changes.  He also notes that he has a history of chronic chest pain related to anxiety and this is similar to that pain as well.  He does endorse increased anxiety since his diagnosis of DVT.  Past Medical History:  Diagnosis Date  . Chest pain   . Essential hypertension 06/03/2013  . GERD (gastroesophageal reflux disease)   . Heart murmur    As a Child  . Hypertension    Borderline    Patient Active Problem List   Diagnosis Date Noted  . Pulmonary embolus (HCC) 04/18/2017  . Left tibial fracture 03/24/2017  . Fracture of tibial shaft, left, closed 03/22/2017  . Essential hypertension 06/03/2013  . Chest pain 03/09/2013    Past Surgical History:  Procedure Laterality Date  . TIBIA IM NAIL INSERTION Left 03/23/2017   Procedure: INTRAMEDULLARY (IM) NAIL TIBIAL;  Surgeon: [name], MD;  Location: [facility] OR;  Service: Orthopedics;  Laterality: Left;       Home Medications    Prior to Admission medications   Medication Sig Start Date End Date  Taking? Authorizing Provider  acetaminophen (TYLENOL) 500 MG tablet Take 500 mg by mouth every 6 (six) hours as needed for headache (pain).   Yes [provider]  amLODipine (NORVASC) 5 MG tablet TAKE 1 TABLET BY MOUTH DAILY Patient taking differently: TAKE 1 TABLET (5 MG) BY MOUTH DAILY 10/16/16  Yes Lars Masson, MD  aspirin 81 MG tablet Take 1 tablet (81 mg total) by mouth daily. Patient taking differently: Take 81 mg by mouth daily as needed (chest pain).  03/23/17 05/07/17 Yes [name], MD  calcium elemental as carbonate (TUMS ULTRA 1000) 400 MG chewable tablet Chew 1,000 mg by mouth 2 (two) times daily as needed for heartburn (indigestion).   Yes [provider]  Multiple Vitamins-Minerals (MULTIVITAMIN WITH MINERALS) tablet Take 1 tablet by mouth daily.   Yes [provider]  Omega-3 Fatty Acids (FISH OIL) 1000 MG CAPS Take 1,000 mg by mouth daily.    Yes [provider]  omeprazole (PRILOSEC) 40 MG capsule Take 40 mg by mouth daily.   Yes [provider]  OVER THE COUNTER MEDICATION Place 15 mg under the tongue daily. CBD oil   Yes [provider]  oxyCODONE (OXY IR/ROXICODONE) 5 MG immediate release tablet Take 5 mg by mouth every 6 (six) hours as needed (pain).  03/24/17  Yes [provider]  Rivaroxaban (  XARELTO) 15 MG TABS tablet Take 15 mg by mouth See admin instructions. Take 1 tablet (15 mg) by mouth twice daily for 21 days (start date 04/18/17   Yes [provider]    Family History Family History  Problem Relation Age of Onset  . Diabetes Unknown        family history  . Hypertension Mother   . Diabetes Father     Social History Social History  Substance Use Topics  . Smoking status: Current Some Day Smoker  . Smokeless tobacco: Never Used     Comment: Quit 2014  . Alcohol use Yes     Comment: occ     Allergies   Patient has no known allergies.   Review of Systems Review of Systems    Constitutional: Negative for chills, fatigue and fever.  HENT: Negative for congestion and rhinorrhea.   Eyes: Negative for visual disturbance.  Respiratory: Negative for cough, shortness of breath and wheezing.   Cardiovascular: Positive for chest pain and leg swelling.  Gastrointestinal: Negative for abdominal pain, diarrhea, nausea and vomiting.  Genitourinary: Negative for dysuria and flank pain.  Musculoskeletal: Negative for neck pain and neck stiffness.  Skin: Negative for rash and wound.  Allergic/Immunologic: Negative for immunocompromised state.  Neurological: Negative for syncope, weakness and headaches.  All other systems reviewed and are negative.    Physical Exam Updated Vital Signs BP 122/65 (BP Location: Left Arm)   Pulse 71   Temp 98.5 F (36.9 C) (Oral)   Resp 18   Ht 5\' 10"  (1.778 m)   Wt 72 kg (158 lb 11.7 oz)   SpO2 98%   BMI 22.78 kg/m   Physical Exam  Constitutional: He is oriented to person, place, and time. He appears well-developed and well-nourished. No distress.  HENT:  Head: Normocephalic and atraumatic.  Eyes: Conjunctivae are normal.  Neck: Neck supple.  Cardiovascular: Normal rate, regular rhythm and normal heart sounds.  Exam reveals no friction rub.   No murmur heard. Pulmonary/Chest: Effort normal and breath sounds normal. No respiratory distress. He has no wheezes. He has no rales.  Abdominal: He exhibits no distension.  Musculoskeletal: He exhibits no edema.  Mild tenderness throughout the left leg distal to the knee.  No obvious deformity.  DP and PT pulses 2+ bilaterally.  Neurological: He is alert and oriented to person, place, and time. He exhibits normal muscle tone.  Skin: Skin is warm. Capillary refill takes less than 2 seconds.  Psychiatric: He has a normal mood and affect.  Nursing note and vitals reviewed.    ED Treatments / Results  Labs (all labs ordered are listed, but only abnormal results are displayed) Labs  Reviewed  BASIC METABOLIC PANEL - Abnormal; Notable for the following:       Result Value   Sodium 133 (*)    Potassium 3.2 (*)    CO2 21 (*)    Glucose, Bld 100 (*)    All other components within normal limits  CBC  BRAIN NATRIURETIC PEPTIDE  BASIC METABOLIC PANEL  TROPONIN I  I-STAT TROPONIN, ED  I-STAT TROPONIN, ED    EKG  EKG Interpretation None       Radiology Dg Chest 2 View  Result Date: 04/18/2017 CLINICAL DATA:  31 year old male with chest pain. History of left lower extremity DVT. EXAM: CHEST  2 VIEW COMPARISON:  01/24/2015 FINDINGS: The heart size and mediastinal contours are within normal limits. Both lungs are clear. The visualized skeletal  structures are unremarkable. IMPRESSION: No radiographic findings of cardiopulmonary disease. Electronically Signed   By: Sande Brothers M.D.   On: 04/18/2017 18:09   Ct Angio Chest Pe W Or Wo Contrast  Result Date: 04/18/2017 CLINICAL DATA:  31 year old male with concern for pulmonary embolism. EXAM: CT ANGIOGRAPHY CHEST WITH CONTRAST TECHNIQUE: Multidetector CT imaging of the chest was performed using the standard protocol during bolus administration of intravenous contrast. Multiplanar CT image reconstructions and MIPs were obtained to evaluate the vascular anatomy. CONTRAST:  100 cc Isovue 370 COMPARISON:  Chest radiograph dated 04/18/2017 FINDINGS: Cardiovascular: There is no cardiomegaly or pericardial effusion. The thoracic aorta is unremarkable. There is pulmonary artery embolus involving the left lower lobe subsegmental branch point (series 7, image 175). There is no CT evidence of right cardiac straining. Mediastinum/Nodes: No hilar or mediastinal adenopathy. Esophagus and the thyroid gland are grossly unremarkable. Lungs/Pleura: The lungs are clear. There is no pleural effusion or pneumothorax. The central airways are patent. Upper Abdomen: No acute abnormality. Musculoskeletal: No chest wall abnormality. No acute or  significant osseous findings. Review of the MIP images confirms the above findings. IMPRESSION: Small left lower lobe subsegmental pulmonary artery embolus. No CT evidence of right heart straining. These results were called by telephone at the time of interpretation on 04/18/2017 at 10:35 pm to Dr. Shaune Pollack , who verbally acknowledged these results. Electronically Signed   By: Elgie Collard M.D.   On: 04/18/2017 22:41    Procedures Procedures (including critical care time)  Medications Ordered in ED Medications  acetaminophen (TYLENOL) tablet 500 mg (not administered)  calcium carbonate (TUMS - dosed in mg elemental calcium) chewable tablet 1,000 mg (not administered)  oxyCODONE (Oxy IR/ROXICODONE) immediate release tablet 5 mg (not administered)  multivitamin with minerals tablet 1 tablet (not administered)  omega-3 acid ethyl esters (LOVAZA) capsule 2 g (not administered)  amLODipine (NORVASC) tablet 5 mg (not administered)  pantoprazole (PROTONIX) EC tablet 40 mg (not administered)  ibuprofen (ADVIL,MOTRIN) tablet 600 mg (not administered)  Rivaroxaban (XARELTO) tablet 15 mg (not administered)    Followed by  rivaroxaban (XARELTO) tablet 20 mg (not administered)  LORazepam (ATIVAN) tablet 1 mg (1 mg Oral Given 04/18/17 2120)  iopamidol (ISOVUE-370) 76 % injection (100 mLs  Contrast Given 04/18/17 2135)  sodium chloride 0.9 % bolus 1,000 mL (0 mLs Intravenous Stopped 04/18/17 2304)  potassium chloride SA (K-DUR,KLOR-CON) CR tablet 40 mEq (40 mEq Oral Given 04/18/17 2304)     Initial Impression / Assessment and Plan / ED Course  I have reviewed the triage vital signs and the nursing notes.  Pertinent labs & imaging results that were available during my care of the patient were reviewed by me and considered in my medical decision making (see chart for details).     31 yo M with recent left tib/fib fx here with acute onset CP. Started Xarelto this morning after DVT study showed  peroneal DVT. CT obtained and shows LLL PE. He has intermittent CP. Will continue fluids, Xarelto, admit for obs, possible TTE given ongoing CP. Pt took his second dose of Xarelto in ED - should be appropriately anticoagulated and would not consider this tx failure at this point. K replaced. Pt remains HDS, satting well. No signs of submassive PE/heart strain.  Final Clinical Impressions(s) / ED Diagnoses   Final diagnoses:  Other acute pulmonary embolism without acute cor pulmonale (HCC)    New Prescriptions Current Discharge Medication List       Erma Heritage,  Sheria Langameron, MD 04/19/17 [place] [id number]    Shaune PollackIsaacs, Trampas Stettner, MD 04/19/17 [phone]

## 2017-04-19 ENCOUNTER — Encounter (HOSPITAL_COMMUNITY): Payer: Self-pay

## 2017-04-19 ENCOUNTER — Other Ambulatory Visit: Payer: Self-pay

## 2017-04-19 DIAGNOSIS — I10 Essential (primary) hypertension: Secondary | ICD-10-CM

## 2017-04-19 DIAGNOSIS — R079 Chest pain, unspecified: Secondary | ICD-10-CM | POA: Diagnosis not present

## 2017-04-19 DIAGNOSIS — I2699 Other pulmonary embolism without acute cor pulmonale: Secondary | ICD-10-CM | POA: Diagnosis not present

## 2017-04-19 DIAGNOSIS — I82402 Acute embolism and thrombosis of unspecified deep veins of left lower extremity: Secondary | ICD-10-CM | POA: Diagnosis not present

## 2017-04-19 LAB — BASIC METABOLIC PANEL
ANION GAP: 7 (ref 5–15)
BUN: 10 mg/dL (ref 6–20)
CALCIUM: 9.2 mg/dL (ref 8.9–10.3)
CO2: 25 mmol/L (ref 22–32)
Chloride: 107 mmol/L (ref 101–111)
Creatinine, Ser: 0.87 mg/dL (ref 0.61–1.24)
GFR calc Af Amer: >60 mL/min (ref >60)
GFR calc non Af Amer: >60 mL/min (ref >60)
GLUCOSE: 101 mg/dL — AB (ref 65–99)
POTASSIUM: 3.7 mmol/L (ref 3.5–5.1)
Sodium: 139 mmol/L (ref 135–145)

## 2017-04-19 LAB — TROPONIN I: Troponin I: <0.03 ng/mL (ref <0.03)

## 2017-04-19 LAB — BRAIN NATRIURETIC PEPTIDE: B Natriuretic Peptide: 25.9 pg/mL (ref 0.0–100.0)

## 2017-04-19 MED ORDER — PANTOPRAZOLE SODIUM 40 MG PO TBEC
40.0000 mg | DELAYED_RELEASE_TABLET | Freq: Every day | ORAL | Status: DC
  Administered 2017-04-19: 40 mg via ORAL
  Filled 2017-04-19: qty 1

## 2017-04-19 MED ORDER — CALCIUM CARBONATE ANTACID 500 MG PO CHEW: 1000.0000 mg | CHEWABLE_TABLET | Freq: Two times a day (BID) | ORAL | Status: DC | PRN

## 2017-04-19 MED ORDER — RIVAROXABAN 15 MG PO TABS
15.0000 mg | ORAL_TABLET | Freq: Two times a day (BID) | ORAL | Status: DC
  Administered 2017-04-19: 15 mg via ORAL
  Filled 2017-04-19: qty 1

## 2017-04-19 MED ORDER — IBUPROFEN 600 MG PO TABS: 600.0000 mg | ORAL_TABLET | Freq: Four times a day (QID) | ORAL | Status: DC | PRN

## 2017-04-19 MED ORDER — LORAZEPAM 1 MG PO TABS
1.0000 mg | ORAL_TABLET | Freq: Once | ORAL | Status: AC
  Administered 2017-04-19: 1 mg via ORAL
  Filled 2017-04-19: qty 1

## 2017-04-19 MED ORDER — OMEGA-3-ACID ETHYL ESTERS 1 G PO CAPS
2.0000 g | ORAL_CAPSULE | Freq: Every day | ORAL | Status: DC
  Administered 2017-04-19: 2 g via ORAL
  Filled 2017-04-19: qty 2

## 2017-04-19 MED ORDER — RIVAROXABAN 20 MG PO TABS: 20.0000 mg | ORAL_TABLET | Freq: Every day | ORAL | Status: DC

## 2017-04-19 MED ORDER — ADULT MULTIVITAMIN W/MINERALS CH
1.0000 | ORAL_TABLET | Freq: Every day | ORAL | Status: DC
  Administered 2017-04-19: 1 via ORAL
  Filled 2017-04-19: qty 1

## 2017-04-19 MED ORDER — ACETAMINOPHEN 500 MG PO TABS: 500.0000 mg | ORAL_TABLET | Freq: Four times a day (QID) | ORAL | Status: DC | PRN

## 2017-04-19 MED ORDER — AMLODIPINE BESYLATE 5 MG PO TABS
5.0000 mg | ORAL_TABLET | Freq: Every day | ORAL | Status: DC
  Administered 2017-04-19: 5 mg via ORAL
  Filled 2017-04-19: qty 1

## 2017-04-19 MED ORDER — RIVAROXABAN 15 MG PO TABS: 15.0000 mg | ORAL_TABLET | ORAL | Status: DC

## 2017-04-19 MED ORDER — OXYCODONE HCL 5 MG PO TABS: 5.0000 mg | ORAL_TABLET | Freq: Four times a day (QID) | ORAL | Status: DC | PRN

## 2017-04-19 NOTE — Discharge Instructions (Signed)
Information on my medicine - XARELTO (rivaroxaban)  This medication education was reviewed with me or my healthcare representative as part of my discharge preparation.  WHY WAS XARELTO PRESCRIBED FOR YOU? Xarelto was prescribed to treat blood clots that may have been found in the veins of your legs (deep vein thrombosis) or in your lungs (pulmonary embolism) and to reduce the risk of them occurring again.  What do you need to know about Xarelto? The starting dose is one 15 mg tablet taken TWICE daily with food for the FIRST 21 DAYS then on  11/25/201/8  the dose is changed to one 20 mg tablet taken ONCE A DAY with your evening meal.  DO NOT stop taking Xarelto without talking to the health care provider who prescribed the medication.  Refill your prescription for 20 mg tablets before you run out.  After discharge, you should have regular check-up appointments with your healthcare provider that is prescribing your Xarelto.  In the future your dose may need to be changed if your kidney function changes by a significant amount.  What do you do if you miss a dose? If you are taking Xarelto TWICE DAILY and you miss a dose, take it as soon as you remember. You may take two 15 mg tablets (total 30 mg) at the same time then resume your regularly scheduled 15 mg twice daily the next day.  If you are taking Xarelto ONCE DAILY and you miss a dose, take it as soon as you remember on the same day then continue your regularly scheduled once daily regimen the next day. Do not take two doses of Xarelto at the same time.   Important Safety Information Xarelto is a blood thinner medicine that can cause bleeding. You should call your healthcare provider right away if you experience any of the following: ? Bleeding from an injury or your nose that does not stop. ? Unusual colored urine (red or dark brown) or unusual colored stools (red or black). ? Unusual bruising for unknown reasons. ? A serious fall  or if you hit your head (even if there is no bleeding).  Some medicines may interact with Xarelto and might increase your risk of bleeding while on Xarelto. To help avoid this, consult your healthcare provider or pharmacist prior to using any new prescription or non-prescription medications, including herbals, vitamins, non-steroidal anti-inflammatory drugs (NSAIDs) and supplements.  This website has more information on Xarelto: [email or web address].xarelto.com.

## 2017-04-19 NOTE — Progress Notes (Signed)
Patient discharged to home with wife. All discharge instructions reviewed. All medications reviewed. Telemetry removed. IV removed. Patient left unit in stable condition.  Avelina LaineKimberly Safaa Stingley RN

## 2017-04-20 NOTE — Discharge Summary (Signed)
Discharge Summary  Cody Fisher:[id number] DOB: 10-03-1985  PCP: Cain Saupe, MD (Inactive)  Admit date: 04/18/2017 Discharge date: 04/19/2017  Time spent: >53mins  Recommendations for Outpatient Follow-up:  1. PCP  Discharge Diagnoses:  Active Hospital Problems   Diagnosis Date Noted  . Pulmonary embolus (HCC) 04/18/2017  . Fracture of tibial shaft, left, closed 03/22/2017  . Essential hypertension 06/03/2013  . Chest pain 03/09/2013    Resolved Hospital Problems  No resolved problems to display.    Discharge Condition: Stable   Diet recommendation: Regular   Vitals:   04/19/17 0053 04/19/17 1006  BP: 122/65 127/68  Pulse: 71 81  Resp: 18 16  Temp: 98.5 F (36.9 C) 98.5 F (36.9 C)  SpO2: 98% 100%    History of present illness:  31 year old male with history of hypertension, GERD, chronic chest pain, anxiety presents to the ED due to left-sided chest pain.  Patient had been experiencing left-sided chest pain and multiple workup has been done on the outside including echo, EGD all negative.  Of note patient was recently diagnosed with DVT of the left lower extremity after he had had a fracture of his tubular/fibula in October, has had surgery.  Since then patient has been relatively immobile, on crutches.  Patient was started on Xarelto and was told if you develop chest pain should come back to the ED.  Patient developed chest pain after taking the first dose of Xarelto and presented in the ER.  CT angios was done which showed PE.  Patient was admitted overnight for observation.  Hospital Course:  Active Problems:   Chest pain   Essential hypertension   Fracture of tibial shaft, left, closed   Pulmonary embolus (HCC)  #PE/Left leg DVT Patient remained hemodynamically stable Doppler showed DVT in the left lower extremity CT angios showed PE, without any right heart strain Since patient was already diagnosed with DVT and prescribed Xarelto encourage patient  to be compliant and take his Xarelto Follow-up with PCP recommended  #Chest pain Troponin x3-, EKG showed no acute ischemic changes Likely due to anxiety, less likely PE Patient advised to seek mental health for managing his anxiety  #Hypertension Stable  #GERD Continue PPI    Procedures:  None  Consultations:  None   Discharge Exam: BP 127/68 (BP Location: Left Arm)   Pulse 81   Temp 98.5 F (36.9 C) (Oral)   Resp 16   Ht 5\' 10"  (1.778 m)   Wt 72 kg (158 lb 11.7 oz)   SpO2 100%   BMI 22.78 kg/m   General: Alert, awake, oriented x3 Cardiovascular: S1-S2 present, no added sounds  Respiratory: Chest clear bilaterally  Discharge Instructions You were cared for by a hospitalist during your hospital stay. If you have any questions about your discharge medications or the care you received while you were in the hospital after you are discharged, you can call the unit and asked to speak with the hospitalist on call if the hospitalist that took care of you is not available. Once you are discharged, your primary care physician will handle any further medical issues. Please note that NO REFILLS for any discharge medications will be authorized once you are discharged, as it is imperative that you return to your primary care physician (or establish a relationship with a primary care physician if you do not have one) for your aftercare needs so that they can reassess your need for medications and monitor your lab values.  Discharge Instructions  Diet - low sodium heart healthy   Complete by:  As directed    Increase activity slowly   Complete by:  As directed      Allergies as of 04/19/2017   No Known Allergies     Medication List    STOP taking these medications   aspirin 81 MG tablet     TAKE these medications   acetaminophen 500 MG tablet Commonly known as:  TYLENOL Take 500 mg by mouth every 6 (six) hours as needed for headache (pain).   amLODipine 5 MG  tablet Commonly known as:  NORVASC TAKE 1 TABLET BY MOUTH DAILY What changed:    how much to take  how to take this  when to take this   Fish Oil 1000 MG Caps Take 1,000 mg by mouth daily.   multivitamin with minerals tablet Take 1 tablet by mouth daily.   omeprazole 40 MG capsule Commonly known as:  PRILOSEC Take 40 mg by mouth daily.   OVER THE COUNTER MEDICATION Place 15 mg under the tongue daily. CBD oil   oxyCODONE 5 MG immediate release tablet Commonly known as:  Oxy IR/ROXICODONE Take 5 mg by mouth every 6 (six) hours as needed (pain).   Rivaroxaban 15 MG Tabs tablet Commonly known as:  XARELTO Take 15 mg by mouth See admin instructions. Take 1 tablet (15 mg) by mouth twice daily for 21 days (start date 04/18/17   TUMS ULTRA 1000 400 MG chewable tablet Generic drug:  calcium elemental as carbonate Chew 1,000 mg by mouth 2 (two) times daily as needed for heartburn (indigestion).      No Known Allergies Follow-up Information    Fulp, Cammie, MD Follow up.   Specialty:  Family Medicine Contact information: [address] [place] [address] [phone]            The results of significant diagnostics from this hospitalization (including imaging, microbiology, ancillary and laboratory) are listed below for reference.    Significant Diagnostic Studies: Dg Chest 2 View  Result Date: 04/18/2017 CLINICAL DATA:  31 year old male with chest pain. History of left lower extremity DVT. EXAM: CHEST  2 VIEW COMPARISON:  01/24/2015 FINDINGS: The heart size and mediastinal contours are within normal limits. Both lungs are clear. The visualized skeletal structures are unremarkable. IMPRESSION: No radiographic findings of cardiopulmonary disease. Electronically Signed   By: Sande Brothers M.D.   On: 04/18/2017 18:09   Dg Tibia/fibula Left  Result Date: 03/23/2017 CLINICAL DATA:  Intraoperative fluoroscopy of left tibia. EXAM: LEFT TIBIA AND FIBULA -  2 VIEW; DG C-ARM 61-120 MIN COMPARISON:  03/22/2017 FINDINGS: Intraoperative fluoroscopic images from fixation of left tibial fracture demonstrate placement of intramedullary nail and screws transfixing a displaced mid to distal left tibial fracture. The alignment post fixation is near anatomic. Accompanied left fibular fracture is noted. Fluoroscopy time is recorded as 95.6  seconds. IMPRESSION: Intraoperative images from intramedullary nail fixation of mid to distal tibial fracture with near anatomic alignment, post fixation. Electronically Signed   By: Ted Mcalpine M.D.   On: 03/23/2017 12:39   Dg Tibia/fibula Left  Result Date: 03/22/2017 CLINICAL DATA:  Soccer injury EXAM: LEFT TIBIA AND FIBULA - 2 VIEW COMPARISON:  None. FINDINGS: There are fractures of the tibia and fibula at the junction of the middle and distal diaphyseal thirds. There is 1/2 shaft with lateral displacement at the tibial fracture. There is complete lateral displacement at the fibular fracture. There is mild  valgus angulation. IMPRESSION: Diaphyseal fractures of the tibia and fibula at the junction of the middle and distal thirds. Electronically Signed   By: Ellery Plunkaniel R Mitchell M.D.   On: 03/22/2017 21:50   Dg Ankle Complete Left  Result Date: 03/22/2017 CLINICAL DATA:  Soccer injury tonight. EXAM: LEFT ANKLE COMPLETE - 3+ VIEW COMPARISON:  None. FINDINGS: Diaphyseal fractures of the tibia and fibula approximately 12 cm above the ankle, with lateral displacement and slight comminution. Ankle joint appears intact. IMPRESSION: Diaphyseal fractures of the tibia and fibula. Electronically Signed   By: Ellery Plunkaniel R Mitchell M.D.   On: 03/22/2017 21:51   Ct Angio Chest Pe W Or Wo Contrast  Result Date: 04/18/2017 CLINICAL DATA:  31 year old male with concern for pulmonary embolism. EXAM: CT ANGIOGRAPHY CHEST WITH CONTRAST TECHNIQUE: Multidetector CT imaging of the chest was performed using the standard protocol during bolus  administration of intravenous contrast. Multiplanar CT image reconstructions and MIPs were obtained to evaluate the vascular anatomy. CONTRAST:  100 cc Isovue 370 COMPARISON:  Chest radiograph dated 04/18/2017 FINDINGS: Cardiovascular: There is no cardiomegaly or pericardial effusion. The thoracic aorta is unremarkable. There is pulmonary artery embolus involving the left lower lobe subsegmental branch point (series 7, image 175). There is no CT evidence of right cardiac straining. Mediastinum/Nodes: No hilar or mediastinal adenopathy. Esophagus and the thyroid gland are grossly unremarkable. Lungs/Pleura: The lungs are clear. There is no pleural effusion or pneumothorax. The central airways are patent. Upper Abdomen: No acute abnormality. Musculoskeletal: No chest wall abnormality. No acute or significant osseous findings. Review of the MIP images confirms the above findings. IMPRESSION: Small left lower lobe subsegmental pulmonary artery embolus. No CT evidence of right heart straining. These results were called by telephone at the time of interpretation on 04/18/2017 at 10:35 pm to Dr. Shaune PollackAMERON ISAACS , who verbally acknowledged these results. Electronically Signed   By: Elgie CollardArash  Radparvar M.D.   On: 04/18/2017 22:41   Dg Chest Portable 1 View  Result Date: 03/22/2017 CLINICAL DATA:  Left lower extremity fracture today, soccer injury. EXAM: PORTABLE CHEST 1 VIEW COMPARISON:  01/24/2015 FINDINGS: A single AP portable view of the chest demonstrates no focal airspace consolidation or alveolar edema. The lungs are grossly clear. There is no large effusion or pneumothorax. Cardiac and mediastinal contours appear unremarkable. IMPRESSION: No active disease. Electronically Signed   By: Ellery Plunkaniel R Mitchell M.D.   On: 03/22/2017 23:46   Dg Tibia/fibula Left Port  Result Date: 03/24/2017 CLINICAL DATA:  As post ORIF for left lower tibia and fibular fractures. EXAM: PORTABLE LEFT TIBIA AND FIBULA - 2 VIEW COMPARISON:   Fluoro spot radiographs of March 23, 2017 FINDINGS: The patient has undergone placement of an into medullary rod with securing screws through the shaft of the tibia. Alignment of the spiral fracture fragments is near anatomic. The adjacent distal fibular diaphyseal fracture exhibits mild displacement but is stable. IMPRESSION: No postprocedure complication following ORIF for a fracture of the junction of the middle and distal thirds of the shaft of the left tibia. The unoperated adjacent fibular shaft fracture is stable in appearance. Electronically Signed   By: David  SwazilandJordan M.D.   On: 03/24/2017 07:25   Dg C-arm 61-120 Min  Result Date: 03/23/2017 CLINICAL DATA:  Intraoperative fluoroscopy of left tibia. EXAM: LEFT TIBIA AND FIBULA - 2 VIEW; DG C-ARM 61-120 MIN COMPARISON:  03/22/2017 FINDINGS: Intraoperative fluoroscopic images from fixation of left tibial fracture demonstrate placement of intramedullary nail and screws transfixing a displaced mid  to distal left tibial fracture. The alignment post fixation is near anatomic. Accompanied left fibular fracture is noted. Fluoroscopy time is recorded as 95.6  seconds. IMPRESSION: Intraoperative images from intramedullary nail fixation of mid to distal tibial fracture with near anatomic alignment, post fixation. Electronically Signed   By: Ted Mcalpine M.D.   On: 03/23/2017 12:39    Microbiology: No results found for this or any previous visit (from the past 240 hour(s)).   Labs: Basic Metabolic Panel: Recent Labs  Lab 04/18/17 1842 04/19/17 0333  NA 133* 139  K 3.2* 3.7  CL 102 107  CO2 21* 25  GLUCOSE 100* 101*  BUN 10 10  CREATININE 0.84 0.87  CALCIUM 9.9 9.2   Liver Function Tests: No results for input(s): AST, ALT, ALKPHOS, BILITOT, PROT, ALBUMIN in the last 168 hours. No results for input(s): LIPASE, AMYLASE in the last 168 hours. No results for input(s): AMMONIA in the last 168 hours. CBC: Recent Labs  Lab 04/18/17 1842   WBC 7.9  HGB 13.6  HCT 40.4  MCV 82.8  PLT 291   Cardiac Enzymes: Recent Labs  Lab 04/19/17 0333  TROPONINI <0.03   BNP: BNP (last 3 results) Recent Labs    04/18/17 2348  BNP 25.9    ProBNP (last 3 results) No results for input(s): PROBNP in the last 8760 hours.  CBG: No results for input(s): GLUCAP in the last 168 hours.     Signed:  Briant Cedar, MD [facility] 04/20/2017, 11:22 PM

## 2017-04-27 ENCOUNTER — Emergency Department (HOSPITAL_COMMUNITY)
Admission: EM | Admit: 2017-04-27 | Discharge: 2017-04-27 | Disposition: A | Discharge status: Home / Self Care | Payer: 59 | Attending: Emergency Medicine | Admitting: Emergency Medicine

## 2017-04-27 ENCOUNTER — Encounter (HOSPITAL_COMMUNITY): Payer: Self-pay | Admitting: Emergency Medicine

## 2017-04-27 ENCOUNTER — Emergency Department (HOSPITAL_COMMUNITY): Payer: 59

## 2017-04-27 ENCOUNTER — Other Ambulatory Visit: Payer: Self-pay

## 2017-04-27 DIAGNOSIS — F17200 Nicotine dependence, unspecified, uncomplicated: Secondary | ICD-10-CM | POA: Diagnosis not present

## 2017-04-27 DIAGNOSIS — Z79899 Other long term (current) drug therapy: Secondary | ICD-10-CM | POA: Insufficient documentation

## 2017-04-27 DIAGNOSIS — Z86711 Personal history of pulmonary embolism: Secondary | ICD-10-CM | POA: Insufficient documentation

## 2017-04-27 DIAGNOSIS — R079 Chest pain, unspecified: Secondary | ICD-10-CM | POA: Diagnosis not present

## 2017-04-27 DIAGNOSIS — I10 Essential (primary) hypertension: Secondary | ICD-10-CM | POA: Insufficient documentation

## 2017-04-27 LAB — BASIC METABOLIC PANEL
ANION GAP: 8 (ref 5–15)
BUN: 14 mg/dL (ref 6–20)
CALCIUM: 10.1 mg/dL (ref 8.9–10.3)
CO2: 27 mmol/L (ref 22–32)
Chloride: 104 mmol/L (ref 101–111)
Creatinine, Ser: 0.91 mg/dL (ref 0.61–1.24)
GLUCOSE: 99 mg/dL (ref 65–99)
Potassium: 3.4 mmol/L — ABNORMAL LOW (ref 3.5–5.1)
Sodium: 139 mmol/L (ref 135–145)

## 2017-04-27 LAB — PROTIME-INR
INR: 1.53
Prothrombin Time: 18.3 seconds — ABNORMAL HIGH (ref 11.4–15.2)

## 2017-04-27 LAB — CBC
HCT: 41.1 % (ref 39.0–52.0)
HEMOGLOBIN: 13.7 g/dL (ref 13.0–17.0)
MCH: 27.7 pg (ref 26.0–34.0)
MCHC: 33.3 g/dL (ref 30.0–36.0)
MCV: 83.2 fL (ref 78.0–100.0)
PLATELETS: 246 10*3/uL (ref 150–400)
RBC: 4.94 MIL/uL (ref 4.22–5.81)
RDW: 13.1 % (ref 11.5–15.5)
WBC: 5.5 10*3/uL (ref 4.0–10.5)

## 2017-04-27 LAB — I-STAT TROPONIN, ED: Troponin i, poc: 0 ng/mL (ref 0.00–0.08)

## 2017-04-27 NOTE — Discharge Instructions (Signed)
Continue Xarelto as previously prescribed.  Return to the emergency department if you develop worsening pain, worsening breathing, high fevers, sputum production, or other new and concerning symptoms.

## 2017-04-27 NOTE — ED Provider Notes (Signed)
[facility] Provider Note   CSN: [id number] Arrival date & time: 04/27/17  0321     History   Chief Complaint Chief Complaint  Patient presents with  . Chest Pain  . Possible PE    HPI Cody Fisher is a 31 y.o. male.  Patient is a 31 year old male with past medical history of hypertension,, left leg fracture with pulmonary embolism/DVT.  This was recently diagnosed and he is currently taking Xarelto.  He presents this evening with complaints of left-sided chest discomfort.  He states this woke him from sleep.  He describes it as a "sticky rib" that lasted approximately 30 minutes, then resolved.  He is now pain-free and denies any shortness of breath.  He was concerned about the possibility of a recurrence of PE.   The history is provided by the patient.  Chest Pain   This is a new problem. The current episode started less than 1 hour ago. The problem occurs constantly. The problem has been resolved. The pain is moderate. The pain does not radiate. Associated symptoms include shortness of breath. Pertinent negatives include no abdominal pain. He has tried nothing for the symptoms. The treatment provided no relief.    Past Medical History:  Diagnosis Date  . Chest pain   . Essential hypertension 06/03/2013  . GERD (gastroesophageal reflux disease)   . Heart murmur    As a Child  . Hypertension    Borderline    Patient Active Problem List   Diagnosis Date Noted  . Pulmonary embolus (HCC) 04/18/2017  . Left tibial fracture 03/24/2017  . Fracture of tibial shaft, left, closed 03/22/2017  . Essential hypertension 06/03/2013  . Chest pain 03/09/2013    History reviewed. No pertinent surgical history.     Home Medications    Prior to Admission medications   Medication Sig Start Date End Date Taking? Authorizing Provider  acetaminophen (TYLENOL) 500 MG tablet Take 500 mg by mouth every 6 (six) hours as needed for headache  (pain).    [provider]  amLODipine (NORVASC) 5 MG tablet TAKE 1 TABLET BY MOUTH DAILY Patient taking differently: TAKE 1 TABLET (5 MG) BY MOUTH DAILY 10/16/16   Lars MassonNelson, Katarina H, MD  calcium elemental as carbonate (TUMS ULTRA 1000) 400 MG chewable tablet Chew 1,000 mg by mouth 2 (two) times daily as needed for heartburn (indigestion).    [provider]  Multiple Vitamins-Minerals (MULTIVITAMIN WITH MINERALS) tablet Take 1 tablet by mouth daily.    [provider]  Omega-3 Fatty Acids (FISH OIL) 1000 MG CAPS Take 1,000 mg by mouth daily.     [provider]  omeprazole (PRILOSEC) 40 MG capsule Take 40 mg by mouth daily.    [provider]  OVER THE COUNTER MEDICATION Place 15 mg under the tongue daily. CBD oil    [provider]  oxyCODONE (OXY IR/ROXICODONE) 5 MG immediate release tablet Take 5 mg by mouth every 6 (six) hours as needed (pain).  03/24/17   [provider]  Rivaroxaban (XARELTO) 15 MG TABS tablet Take 15 mg by mouth See admin instructions. Take 1 tablet (15 mg) by mouth twice daily for 21 days (start date 04/18/17    [provider]    Family History Family History  Problem Relation Age of Onset  . Diabetes Unknown        family history  . Hypertension Mother   . Diabetes Father     Social  History Social History   Tobacco Use  . Smoking status: Current Some Day Smoker  . Smokeless tobacco: Never Used  . Tobacco comment: Quit 2014  Substance Use Topics  . Alcohol use: Yes    Comment: occ  . Drug use: No     Allergies   Patient has no known allergies.   Review of Systems Review of Systems  Respiratory: Positive for shortness of breath.   Cardiovascular: Positive for chest pain.  Gastrointestinal: Negative for abdominal pain.  All other systems reviewed and are negative.    Physical Exam Updated Vital Signs BP 135/86 (BP Location: Right Arm)   Pulse 73   Temp 98.7 F (37.1 C)  (Oral)   Resp 20   Ht 5\' 10"  (1.778 m)   Wt 72.1 kg (159 lb)   SpO2 98%   BMI 22.81 kg/m   Physical Exam  Constitutional: He is oriented to person, place, and time. He appears well-developed and well-nourished. No distress.  HENT:  Head: Normocephalic and atraumatic.  Mouth/Throat: Oropharynx is clear and moist.  Neck: Normal range of motion. Neck supple.  Cardiovascular: Normal rate and regular rhythm. Exam reveals no friction rub.  No murmur heard. Pulmonary/Chest: Effort normal and breath sounds normal. No tachypnea. No respiratory distress. He has no wheezes. He has no rales.  Abdominal: Soft. Bowel sounds are normal. He exhibits no distension. There is no tenderness.  Musculoskeletal: Normal range of motion. He exhibits no edema.  Neurological: He is alert and oriented to person, place, and time. Coordination normal.  Skin: Skin is warm and dry. He is not diaphoretic.  Nursing note and vitals reviewed.    ED Treatments / Results  Labs (all labs ordered are listed, but only abnormal results are displayed) Labs Reviewed  BASIC METABOLIC PANEL - Abnormal; Notable for the following components:      Result Value   Potassium 3.4 (*)    All other components within normal limits  PROTIME-INR - Abnormal; Notable for the following components:   Prothrombin Time 18.3 (*)    All other components within normal limits  CBC  I-STAT TROPONIN, ED    EKG  EKG Interpretation None       Radiology Dg Chest 2 View  Result Date: 04/27/2017 CLINICAL DATA:  LEFT chest pain. Recent diagnosis of pulmonary embolism. EXAM: CHEST  2 VIEW COMPARISON:  CT chest April 18, 2017 FINDINGS: Cardiomediastinal silhouette is normal. No pleural effusions or focal consolidations. Trachea projects midline and there is no pneumothorax. Soft tissue planes and included osseous structures are non-suspicious. IMPRESSION: Normal chest. Electronically Signed   By: Awilda Metroourtnay  Bloomer M.D.   On: 04/27/2017  04:36    Procedures Procedures (including critical care time)  Medications Ordered in ED Medications - No data to display   Initial Impression / Assessment and Plan / ED Course  I have reviewed the triage vital signs and the nursing notes.  Pertinent labs & imaging results that were available during my care of the patient were reviewed by me and considered in my medical decision making (see chart for details).  Patient with documented PE presenting with left-sided chest pain that lasted approximately 30 seconds.  He reports being compliant with his Xarelto.  His workup today reveals normal laboratory studies and no acute findings on his chest x-ray.  His oxygen saturations are 100% and heart rate is in the 60s.  As his pain was transient, lasting only 30 minutes, and normal vital signs, I highly  doubt a recurrence of PE.  We discussed the possibility of repeat CT scan, however have decided against this as I do not feel as though it would change the management.  I also do not feel that he has had another PE based on clinical history, vital signs.  He will be discharged with continued Xarelto and return as needed.  Final Clinical Impressions(s) / ED Diagnoses   Final diagnoses:  None    ED Discharge Orders    None       Geoffery Lyons, MD 04/27/17 [id number]

## 2017-04-27 NOTE — ED Triage Notes (Signed)
Pt complains of chest pain on the left side of his chest. Pt states it feels like a sticky pain. Pt states he was just diagnosed with a PE last Saturday and is worried he might have another. Pt states this pain woke him up from his sleep.

## 2017-05-29 ENCOUNTER — Encounter (HOSPITAL_COMMUNITY): Payer: Self-pay | Admitting: Emergency Medicine

## 2017-05-29 ENCOUNTER — Other Ambulatory Visit: Payer: Self-pay

## 2017-05-29 DIAGNOSIS — R0789 Other chest pain: Secondary | ICD-10-CM | POA: Insufficient documentation

## 2017-05-29 DIAGNOSIS — Z86711 Personal history of pulmonary embolism: Secondary | ICD-10-CM | POA: Insufficient documentation

## 2017-05-29 DIAGNOSIS — I10 Essential (primary) hypertension: Secondary | ICD-10-CM | POA: Diagnosis not present

## 2017-05-29 DIAGNOSIS — F17200 Nicotine dependence, unspecified, uncomplicated: Secondary | ICD-10-CM | POA: Diagnosis not present

## 2017-05-29 DIAGNOSIS — Z79899 Other long term (current) drug therapy: Secondary | ICD-10-CM | POA: Insufficient documentation

## 2017-05-29 DIAGNOSIS — Z7901 Long term (current) use of anticoagulants: Secondary | ICD-10-CM | POA: Diagnosis not present

## 2017-05-29 NOTE — ED Triage Notes (Signed)
Pt reports R sided chest pain worse with inspiration and movement for the last 3-4 days. States Hx of PE and DVT, taking xarelto 20MG  at 9AM. Some shortness of breath intermittently, not at this time.

## 2017-05-30 ENCOUNTER — Emergency Department (HOSPITAL_COMMUNITY): Payer: 59

## 2017-05-30 ENCOUNTER — Emergency Department (HOSPITAL_COMMUNITY)
Admission: EM | Admit: 2017-05-30 | Discharge: 2017-05-30 | Disposition: A | Discharge status: Home / Self Care | Payer: 59 | Attending: Emergency Medicine | Admitting: Emergency Medicine

## 2017-05-30 DIAGNOSIS — R079 Chest pain, unspecified: Secondary | ICD-10-CM

## 2017-05-30 DIAGNOSIS — Z86711 Personal history of pulmonary embolism: Secondary | ICD-10-CM

## 2017-05-30 LAB — CBC
HCT: 41.9 % (ref 39.0–52.0)
HEMOGLOBIN: 14.2 g/dL (ref 13.0–17.0)
MCH: 27.8 pg (ref 26.0–34.0)
MCHC: 33.9 g/dL (ref 30.0–36.0)
MCV: 82.2 fL (ref 78.0–100.0)
Platelets: 267 10*3/uL (ref 150–400)
RBC: 5.1 MIL/uL (ref 4.22–5.81)
RDW: 12.8 % (ref 11.5–15.5)
WBC: 9 10*3/uL (ref 4.0–10.5)

## 2017-05-30 LAB — I-STAT TROPONIN, ED: TROPONIN I, POC: 0 ng/mL (ref 0.00–0.08)

## 2017-05-30 LAB — BASIC METABOLIC PANEL
ANION GAP: 8 (ref 5–15)
BUN: 11 mg/dL (ref 6–20)
CALCIUM: 9.8 mg/dL (ref 8.9–10.3)
CO2: 24 mmol/L (ref 22–32)
Chloride: 105 mmol/L (ref 101–111)
Creatinine, Ser: 0.82 mg/dL (ref 0.61–1.24)
GFR calc non Af Amer: >60 mL/min (ref >60)
Glucose, Bld: 107 mg/dL — ABNORMAL HIGH (ref 65–99)
Potassium: 3.4 mmol/L — ABNORMAL LOW (ref 3.5–5.1)
Sodium: 137 mmol/L (ref 135–145)

## 2017-05-30 LAB — PROTIME-INR
INR: 1.12
PROTHROMBIN TIME: 14.3 s (ref 11.4–15.2)

## 2017-05-30 MED ORDER — SODIUM CHLORIDE 0.9 % IV BOLUS (SEPSIS)
1000.0000 mL | Freq: Once | INTRAVENOUS | Status: AC
  Administered 2017-05-30: 1000 mL via INTRAVENOUS

## 2017-05-30 MED ORDER — IOPAMIDOL (ISOVUE-370) INJECTION 76%
INTRAVENOUS | Status: AC
  Administered 2017-05-30: 100 mL
  Filled 2017-05-30: qty 100

## 2017-05-30 NOTE — Discharge Instructions (Signed)
Continue your Xarelto as previously prescribed.  Follow-up with your primary doctor if symptoms not improving in the next week, and return to the ER if symptoms significantly worsen or change.

## 2017-05-30 NOTE — ED Provider Notes (Signed)
[facility]ENC[facility] Provider Note   CSN: [id number] Arrival date & time: 05/29/17  2315     History   Chief Complaint Chief Complaint  Patient presents with  . Chest Pain    HPI Cody Fisher is a 31 y.o. male.  Patient is a 31 year old male with past medical history of recently diagnosed pulmonary embolism being treated with Xarelto.  This PE occurred following surgery he had his leg following a fracture.  He presents today with complaints of right sided chest discomfort that started 3 or 4 days ago.  He is uncertain as to whether or not he injured himself shoveling snow but denies any other injury.  His pain is worse with position and deep breathing.  He denies any fevers or chills.  He denies any productive cough.  He reports being compliant with his Xarelto.   The history is provided by the patient.  Chest Pain   This is a recurrent problem. Episode onset: 3 or 4 days ago. The problem occurs constantly. The problem has been gradually worsening. Associated with: Position and deep breathing. The pain is present in the lateral region. The pain is moderate. The pain does not radiate. The symptoms are aggravated by certain positions and deep breathing.    Past Medical History:  Diagnosis Date  . Chest pain   . Essential hypertension 06/03/2013  . GERD (gastroesophageal reflux disease)   . Heart murmur    As a Child  . Hypertension    Borderline    Patient Active Problem List   Diagnosis Date Noted  . Pulmonary embolus (HCC) 04/18/2017  . Left tibial fracture 03/24/2017  . Fracture of tibial shaft, left, closed 03/22/2017  . Essential hypertension 06/03/2013  . Chest pain 03/09/2013    Past Surgical History:  Procedure Laterality Date  . TIBIA IM NAIL INSERTION Left 03/23/2017   Procedure: INTRAMEDULLARY (IM) NAIL TIBIAL;  Surgeon: Bjorn PippinVarkey, Dax T, MD;  Location: [facility] OR;  Service: Orthopedics;  Laterality: Left;       Home  Medications    Prior to Admission medications   Medication Sig Start Date End Date Taking? Authorizing Provider  acetaminophen (TYLENOL) 500 MG tablet Take 500 mg by mouth every 6 (six) hours as needed for headache (pain).    [provider]  amLODipine (NORVASC) 5 MG tablet TAKE 1 TABLET BY MOUTH DAILY Patient taking differently: TAKE 1 TABLET (5 MG) BY MOUTH DAILY 10/16/16   Lars MassonNelson, Katarina H, MD  calcium elemental as carbonate (TUMS ULTRA 1000) 400 MG chewable tablet Chew 1,000 mg by mouth 2 (two) times daily as needed for heartburn (indigestion).    [provider]  Multiple Vitamins-Minerals (MULTIVITAMIN WITH MINERALS) tablet Take 1 tablet by mouth daily.    [provider]  Omega-3 Fatty Acids (FISH OIL) 1000 MG CAPS Take 1,000 mg by mouth daily.     [provider]  omeprazole (PRILOSEC) 40 MG capsule Take 40 mg by mouth daily.    [provider]  OVER THE COUNTER MEDICATION Place 15 mg under the tongue daily. CBD oil    [provider]  oxyCODONE (OXY IR/ROXICODONE) 5 MG immediate release tablet Take 5 mg by mouth every 6 (six) hours as needed (pain).  03/24/17   [provider]  Rivaroxaban (XARELTO) 15 MG TABS tablet Take 15 mg by mouth See admin instructions. Take 1 tablet (15 mg) by mouth twice daily for 21 days (start date 04/18/17  [provider]    Family History Family History  Problem Relation Age of Onset  . Diabetes Unknown        family history  . Hypertension Mother   . Diabetes Father     Social History Social History   Tobacco Use  . Smoking status: Current Some Day Smoker  . Smokeless tobacco: Never Used  . Tobacco comment: Quit 2014  Substance Use Topics  . Alcohol use: Yes    Comment: occ  . Drug use: Yes    Types: Marijuana     Allergies   Patient has no known allergies.   Review of Systems Review of Systems  Cardiovascular: Positive for chest pain.  All other systems  reviewed and are negative.    Physical Exam Updated Vital Signs BP (!) 144/89 (BP Location: Left Arm)   Pulse 89   Temp 98.2 F (36.8 C) (Oral)   Resp 15   SpO2 100%   Physical Exam  Constitutional: He is oriented to person, place, and time. He appears well-developed and well-nourished. No distress.  HENT:  Head: Normocephalic and atraumatic.  Mouth/Throat: Oropharynx is clear and moist.  Neck: Normal range of motion. Neck supple.  Cardiovascular: Normal rate and regular rhythm. Exam reveals no friction rub.  No murmur heard. Pulmonary/Chest: Effort normal and breath sounds normal. No accessory muscle usage. No respiratory distress. He has no decreased breath sounds. He has no wheezes. He has no rales.  Abdominal: Soft. Bowel sounds are normal. He exhibits no distension. There is no tenderness.  Musculoskeletal: Normal range of motion. He exhibits no edema.  Neurological: He is alert and oriented to person, place, and time. Coordination normal.  Skin: Skin is warm and dry. He is not diaphoretic.  Nursing note and vitals reviewed.    ED Treatments / Results  Labs (all labs ordered are listed, but only abnormal results are displayed) Labs Reviewed  BASIC METABOLIC PANEL - Abnormal; Notable for the following components:      Result Value   Potassium 3.4 (*)    Glucose, Bld 107 (*)    All other components within normal limits  CBC  PROTIME-INR  I-STAT TROPONIN, ED    EKG  EKG Interpretation  Date/Time:  Friday May 29 2017 23:18:55 EST Ventricular Rate:  88 PR Interval:  152 QRS Duration: 106 QT Interval:  350 QTC Calculation: 423 R Axis:   80 Text Interpretation:  Normal sinus rhythm Incomplete right bundle branch block Borderline ECG Confirmed by Geoffery LyonseLo, Akosua Constantine ([id number]) on 05/30/2017 12:23:24 AM       Radiology No results found.  Procedures Procedures (including critical care time)  Medications Ordered in ED Medications  sodium chloride 0.9 % bolus  1,000 mL (not administered)     Initial Impression / Assessment and Plan / ED Course  I have reviewed the triage vital signs and the nursing notes.  Pertinent labs & imaging results that were available during my care of the patient were reviewed by me and considered in my medical decision making (see chart for details).  Patient with prior left-sided pulmonary embolism currently being treated with Xarelto presenting with right-sided chest pain.  He is concerned he is having another clot.  His workup today reveals a resolution of the prior left-sided clot and no new clot on the right.  His scan is clear.  I suspect his discomfort is musculoskeletal in nature and see no occasion for any further workup.  At this point he will be  discharged, to return as needed if he experiences additional problems.  He is to continue his Xarelto as previously prescribed and planned.  Final Clinical Impressions(s) / ED Diagnoses   Final diagnoses:  None    ED Discharge Orders    None       Geoffery Lyons, MD 05/30/17 [phone]

## 2017-05-30 NOTE — ED Notes (Signed)
Taken to CT at this time. 

## 2017-06-11 ENCOUNTER — Encounter (HOSPITAL_COMMUNITY): Payer: Self-pay | Admitting: Emergency Medicine

## 2017-06-11 ENCOUNTER — Emergency Department (HOSPITAL_COMMUNITY): Payer: 59

## 2017-06-11 ENCOUNTER — Other Ambulatory Visit: Payer: Self-pay

## 2017-06-11 ENCOUNTER — Emergency Department (HOSPITAL_COMMUNITY)
Admission: EM | Admit: 2017-06-11 | Discharge: 2017-06-11 | Disposition: A | Discharge status: Home / Self Care | Payer: 59 | Attending: Emergency Medicine | Admitting: Emergency Medicine

## 2017-06-11 DIAGNOSIS — Y939 Activity, unspecified: Secondary | ICD-10-CM | POA: Insufficient documentation

## 2017-06-11 DIAGNOSIS — Y999 Unspecified external cause status: Secondary | ICD-10-CM | POA: Insufficient documentation

## 2017-06-11 DIAGNOSIS — Y92513 Shop (commercial) as the place of occurrence of the external cause: Secondary | ICD-10-CM | POA: Insufficient documentation

## 2017-06-11 DIAGNOSIS — S0990XA Unspecified injury of head, initial encounter: Secondary | ICD-10-CM | POA: Diagnosis present

## 2017-06-11 DIAGNOSIS — S0083XA Contusion of other part of head, initial encounter: Secondary | ICD-10-CM | POA: Insufficient documentation

## 2017-06-11 DIAGNOSIS — I10 Essential (primary) hypertension: Secondary | ICD-10-CM | POA: Insufficient documentation

## 2017-06-11 DIAGNOSIS — W2209XA Striking against other stationary object, initial encounter: Secondary | ICD-10-CM | POA: Diagnosis not present

## 2017-06-11 DIAGNOSIS — Z79899 Other long term (current) drug therapy: Secondary | ICD-10-CM | POA: Diagnosis not present

## 2017-06-11 DIAGNOSIS — F17200 Nicotine dependence, unspecified, uncomplicated: Secondary | ICD-10-CM | POA: Diagnosis not present

## 2017-06-11 DIAGNOSIS — S0093XA Contusion of unspecified part of head, initial encounter: Secondary | ICD-10-CM

## 2017-06-11 NOTE — ED Notes (Signed)
Patient transported to CT 

## 2017-06-11 NOTE — Discharge Instructions (Signed)
There were no findings of a significant nature on the CAT scan or your physical exam  It is safe to take either ibuprofen or acetaminophen for pain.  Return here, if needed, for problems.

## 2017-06-11 NOTE — ED Provider Notes (Signed)
[facility] EMERGENCY DEPARTMENT Provider Note   CSN: [id number] Arrival date & time: 06/11/17  4098     History   Chief Complaint Chief Complaint  Patient presents with  . Headache    HPI Cody Fisher is a 31 y.o. male.  He presents for evaluation of headache, feels like "pressure or congestion."  Gradual onset of headache following bumping his head on a shelf, as he was standing up from looking at shoes, at a store.  This occurred 4 days ago.  There has been no associated nausea, vomiting, vision changes, gait disorder, behavioral changes, blurred vision, neck or back pain.  He is concerned, because he is on Xarelto for a DVT.  He is recovering from a left leg fracture.  There are no other known modifying factors.  HPI  Past Medical History:  Diagnosis Date  . Chest pain   . Essential hypertension 06/03/2013  . GERD (gastroesophageal reflux disease)   . Heart murmur    As a Child  . Hypertension    Borderline    Patient Active Problem List   Diagnosis Date Noted  . Pulmonary embolus (HCC) 04/18/2017  . Left tibial fracture 03/24/2017  . Fracture of tibial shaft, left, closed 03/22/2017  . Essential hypertension 06/03/2013  . Chest pain 03/09/2013    Past Surgical History:  Procedure Laterality Date  . TIBIA IM NAIL INSERTION Left 03/23/2017   Procedure: INTRAMEDULLARY (IM) NAIL TIBIAL;  Surgeon: [name], MD;  Location: [facility] OR;  Service: Orthopedics;  Laterality: Left;       Home Medications    Prior to Admission medications   Medication Sig Start Date End Date Taking? Authorizing Provider  ALPRAZolam Prudy Feeler) 0.5 MG tablet Take 0.5 mg by mouth 3 (three) times daily as needed for anxiety.   Yes [provider]  amLODipine (NORVASC) 5 MG tablet TAKE 1 TABLET BY MOUTH DAILY Patient taking differently: TAKE 1 TABLET (5 MG) BY MOUTH DAILY 10/16/16  Yes Lars Masson, MD  Multiple Vitamins-Minerals (MULTIVITAMIN WITH MINERALS)  tablet Take 1 tablet by mouth daily.   Yes [provider]  Omega-3 Fatty Acids (FISH OIL) 1000 MG CAPS Take 1,000 mg by mouth daily.    Yes [provider]  omeprazole (PRILOSEC) 40 MG capsule Take 40 mg by mouth daily.   Yes [provider]  OVER THE COUNTER MEDICATION Place 15 mg under the tongue daily. CBD oil   Yes [provider]  rivaroxaban (XARELTO) 20 MG TABS tablet Take 20 mg by mouth daily.   Yes [provider]  sertraline (ZOLOFT) 50 MG tablet Take 50 mg by mouth daily.   Yes [provider]    Family History Family History  Problem Relation Age of Onset  . Diabetes Unknown        family history  . Hypertension Mother   . Diabetes Father     Social History Social History   Tobacco Use  . Smoking status: Current Some Day Smoker  . Smokeless tobacco: Never Used  . Tobacco comment: Quit 2014  Substance Use Topics  . Alcohol use: Yes    Comment: occ  . Drug use: No     Allergies   Patient has no known allergies.   Review of Systems Review of Systems  All other systems reviewed and are negative.    Physical Exam Updated Vital Signs BP 121/80   Pulse 65   Temp 98.2 F (36.8 C) (Oral)  Resp 16   Ht 5\' 10"  (1.778 m)   Wt 70.3 kg (155 lb)   SpO2 99%   BMI 22.24 kg/m   Physical Exam  Constitutional: He is oriented to person, place, and time. He appears well-developed and well-nourished. He does not appear ill.  HENT:  Head: Normocephalic.  Right Ear: External ear normal.  Left Ear: External ear normal.  Small contusion mid parietal region, without abrasion or laceration.  Eyes: Conjunctivae and EOM are normal. Pupils are equal, round, and reactive to light.  Neck: Normal range of motion and phonation normal. Neck supple.  Cardiovascular: Normal rate, regular rhythm and normal heart sounds.  Pulmonary/Chest: Effort normal and breath sounds normal. He exhibits no bony tenderness.  Abdominal:  Soft. There is no tenderness.  Musculoskeletal: Normal range of motion.  Neurological: He is alert and oriented to person, place, and time. No cranial nerve deficit or sensory deficit. He exhibits normal muscle tone. Coordination normal.  No dysarthria, aphasia or nystagmus.  Normal finger to nose and heel to shin bilaterally.  Skin: Skin is warm, dry and intact.  Psychiatric: His behavior is normal. Judgment and thought content normal. His mood appears anxious.  Nursing note and vitals reviewed.    ED Treatments / Results  Labs (all labs ordered are listed, but only abnormal results are displayed) Labs Reviewed - No data to display  EKG  EKG Interpretation None       Radiology Ct Head Wo Contrast  Result Date: 06/11/2017 CLINICAL DATA:  Trauma to the vertex 4 days ago. Headache and dizziness. Anti coagulated. EXAM: CT HEAD WITHOUT CONTRAST TECHNIQUE: Contiguous axial images were obtained from the base of the skull through the vertex without intravenous contrast. COMPARISON:  None. FINDINGS: Brain: No evidence of malformation, atrophy, old or acute small or large vessel infarction, mass lesion, hemorrhage, hydrocephalus or extra-axial collection. No evidence of pituitary lesion. Vascular: No vascular calcification.  No hyperdense vessels. Skull: Normal.  No fracture or focal bone lesion. Sinuses/Orbits: Visualized sinuses are clear. No fluid in the middle ears or mastoids. Visualized orbits are normal. Other: None significant IMPRESSION: Normal head CT.  No traumatic finding. Electronically Signed   By: Paulina FusiMark  Shogry M.D.   On: 06/11/2017 09:19    Procedures Procedures (including critical care time)  Medications Ordered in ED Medications - No data to display   Initial Impression / Assessment and Plan / ED Course  I have reviewed the triage vital signs and the nursing notes.  Pertinent labs & imaging results that were available during my care of the patient were reviewed by me  and considered in my medical decision making (see chart for details).  Clinical Course as of Jun 11 932  Thu Jun 11, 2017  0925 Normal CT Head Wo Contrast [EW]    Clinical Course User Index [EW] Mancel BaleWentz, Atoya Andrew, MD     Patient Vitals for the past 24 hrs:  BP Temp Temp src Pulse Resp SpO2 Height Weight  06/11/17 0900 121/80 - - 65 16 99 % - -  06/11/17 0845 127/73 - - 65 - 98 % - -  06/11/17 0830 (!) 108/97 - - 66 - 100 % - -  06/11/17 0800 124/76 - - 73 - 99 % - -  06/11/17 0730 114/88 - - 72 - 99 % - -  06/11/17 0729 - - - - - - 5\' 10"  (1.778 m) 70.3 kg (155 lb)  06/11/17 0727 115/78 98.2 F (36.8 C) Oral 70  16 100 % - -    9:31 AM Reevaluation with update and discussion. After initial assessment and treatment, an updated evaluation reveals no change in clinical status, findings discussed with patient all questions answered. Mancel BaleElliott Jontavia Leatherbury      Final Clinical Impressions(s) / ED Diagnoses   Final diagnoses:  Contusion of head, unspecified part of head, initial encounter   Contusion head without significant historical or physical exam findings.  Doubt significant intracranial injury, concussion, bleeding, other musculoskeletal injury.  Nursing Notes Reviewed/ Care Coordinated Applicable Imaging Reviewed Interpretation of Laboratory Data incorporated into ED treatment  The patient appears reasonably screened and/or stabilized for discharge and I doubt any other medical condition or other [facility] requiring further screening, evaluation, or treatment in the ED at this time prior to discharge.  Plan: Home Medications-OTC analgesia if needed; Home Treatments-rest; return here if the recommended treatment, does not improve the symptoms; Recommended follow up-PCP, as needed   ED Discharge Orders    None       Mancel BaleWentz, Shannon Kirkendall, MD 06/11/17 [phone]

## 2017-06-11 NOTE — ED Triage Notes (Signed)
Patient reports hitting head on shelf at a shoe store this past Monday.  Denies LOC. Has had increasing pain and pressure since then. Reports breaking left leg in October, takes blood thinners after PE then. Patient alert and oriented x4.

## 2017-06-26 ENCOUNTER — Other Ambulatory Visit: Payer: Self-pay | Admitting: Cardiology

## 2017-07-10 ENCOUNTER — Other Ambulatory Visit (HOSPITAL_COMMUNITY): Payer: Self-pay | Admitting: Orthopaedic Surgery

## 2017-07-10 ENCOUNTER — Ambulatory Visit (HOSPITAL_COMMUNITY)
Admission: RE | Admit: 2017-07-10 | Discharge: 2017-07-10 | Disposition: A | Discharge status: Home / Self Care | Payer: 59 | Source: Ambulatory Visit | Attending: Orthopaedic Surgery | Admitting: Orthopaedic Surgery

## 2017-07-10 DIAGNOSIS — M7989 Other specified soft tissue disorders: Principal | ICD-10-CM

## 2017-07-10 DIAGNOSIS — M79605 Pain in left leg: Secondary | ICD-10-CM

## 2017-07-10 NOTE — Progress Notes (Signed)
LLE venous duplex prelim: negative for DVT. Amory Simonetti Eunice, RDMS, RVT Attempted call report, left voice message with results. 

## 2018-07-20 ENCOUNTER — Other Ambulatory Visit: Payer: Self-pay | Admitting: Physician Assistant

## 2018-07-20 DIAGNOSIS — R7989 Other specified abnormal findings of blood chemistry: Secondary | ICD-10-CM

## 2018-07-20 DIAGNOSIS — R945 Abnormal results of liver function studies: Principal | ICD-10-CM

## 2018-07-22 ENCOUNTER — Ambulatory Visit
Admission: RE | Admit: 2018-07-22 | Discharge: 2018-07-22 | Disposition: A | Discharge status: Home / Self Care | Payer: BLUE CROSS/BLUE SHIELD | Source: Ambulatory Visit | Attending: Physician Assistant | Admitting: Physician Assistant

## 2018-07-22 DIAGNOSIS — R7989 Other specified abnormal findings of blood chemistry: Secondary | ICD-10-CM

## 2018-07-22 DIAGNOSIS — R945 Abnormal results of liver function studies: Principal | ICD-10-CM

## 2019-10-23 IMAGING — DX DG CHEST 2V
2 series · 2 of 2 positions shown · non-contrast
Comparison: CT chest April 18, 2017

CLINICAL DATA: LEFT chest pain. Recent diagnosis of pulmonary
embolism.

EXAM:
CHEST  2 VIEW

[chest pa]
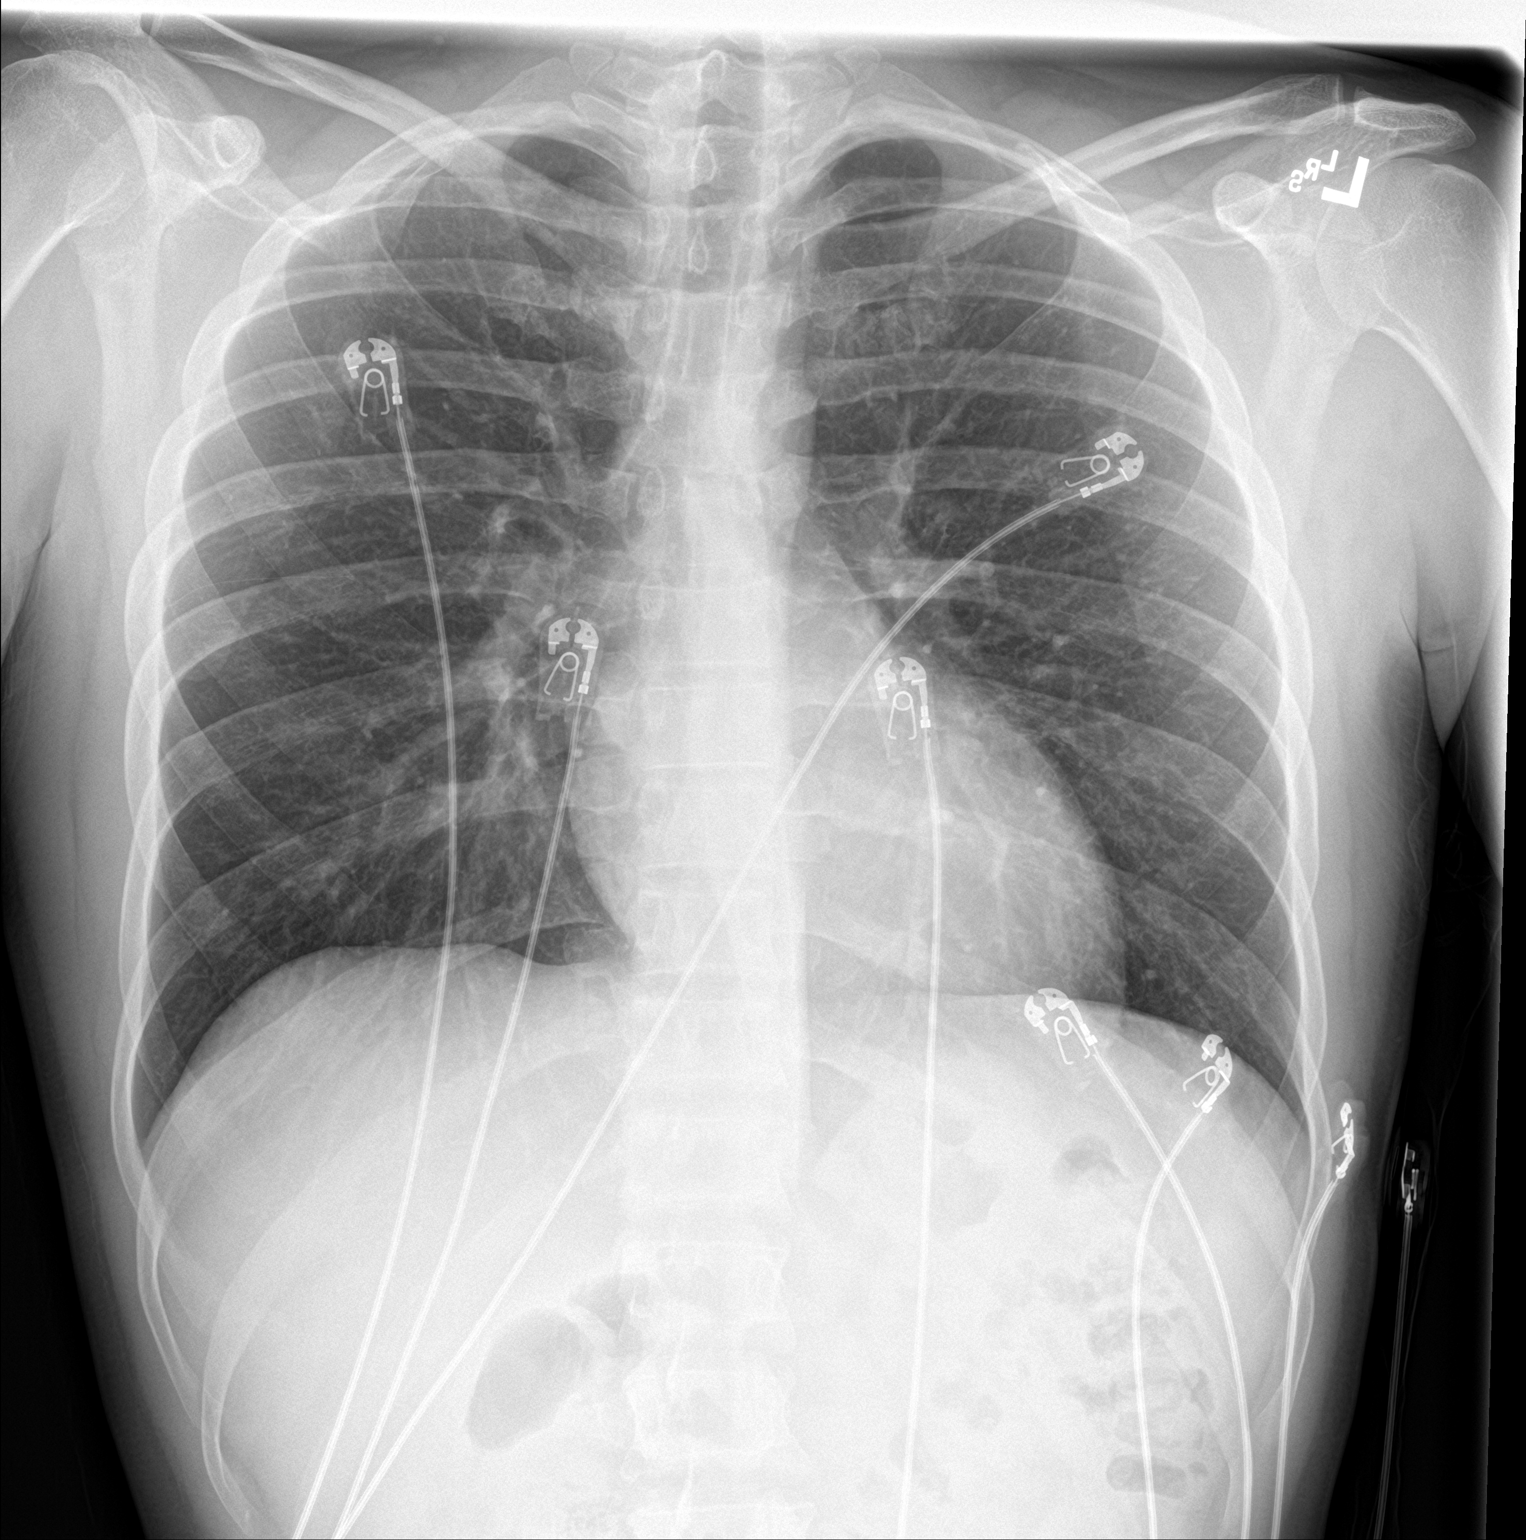

[chest lat]
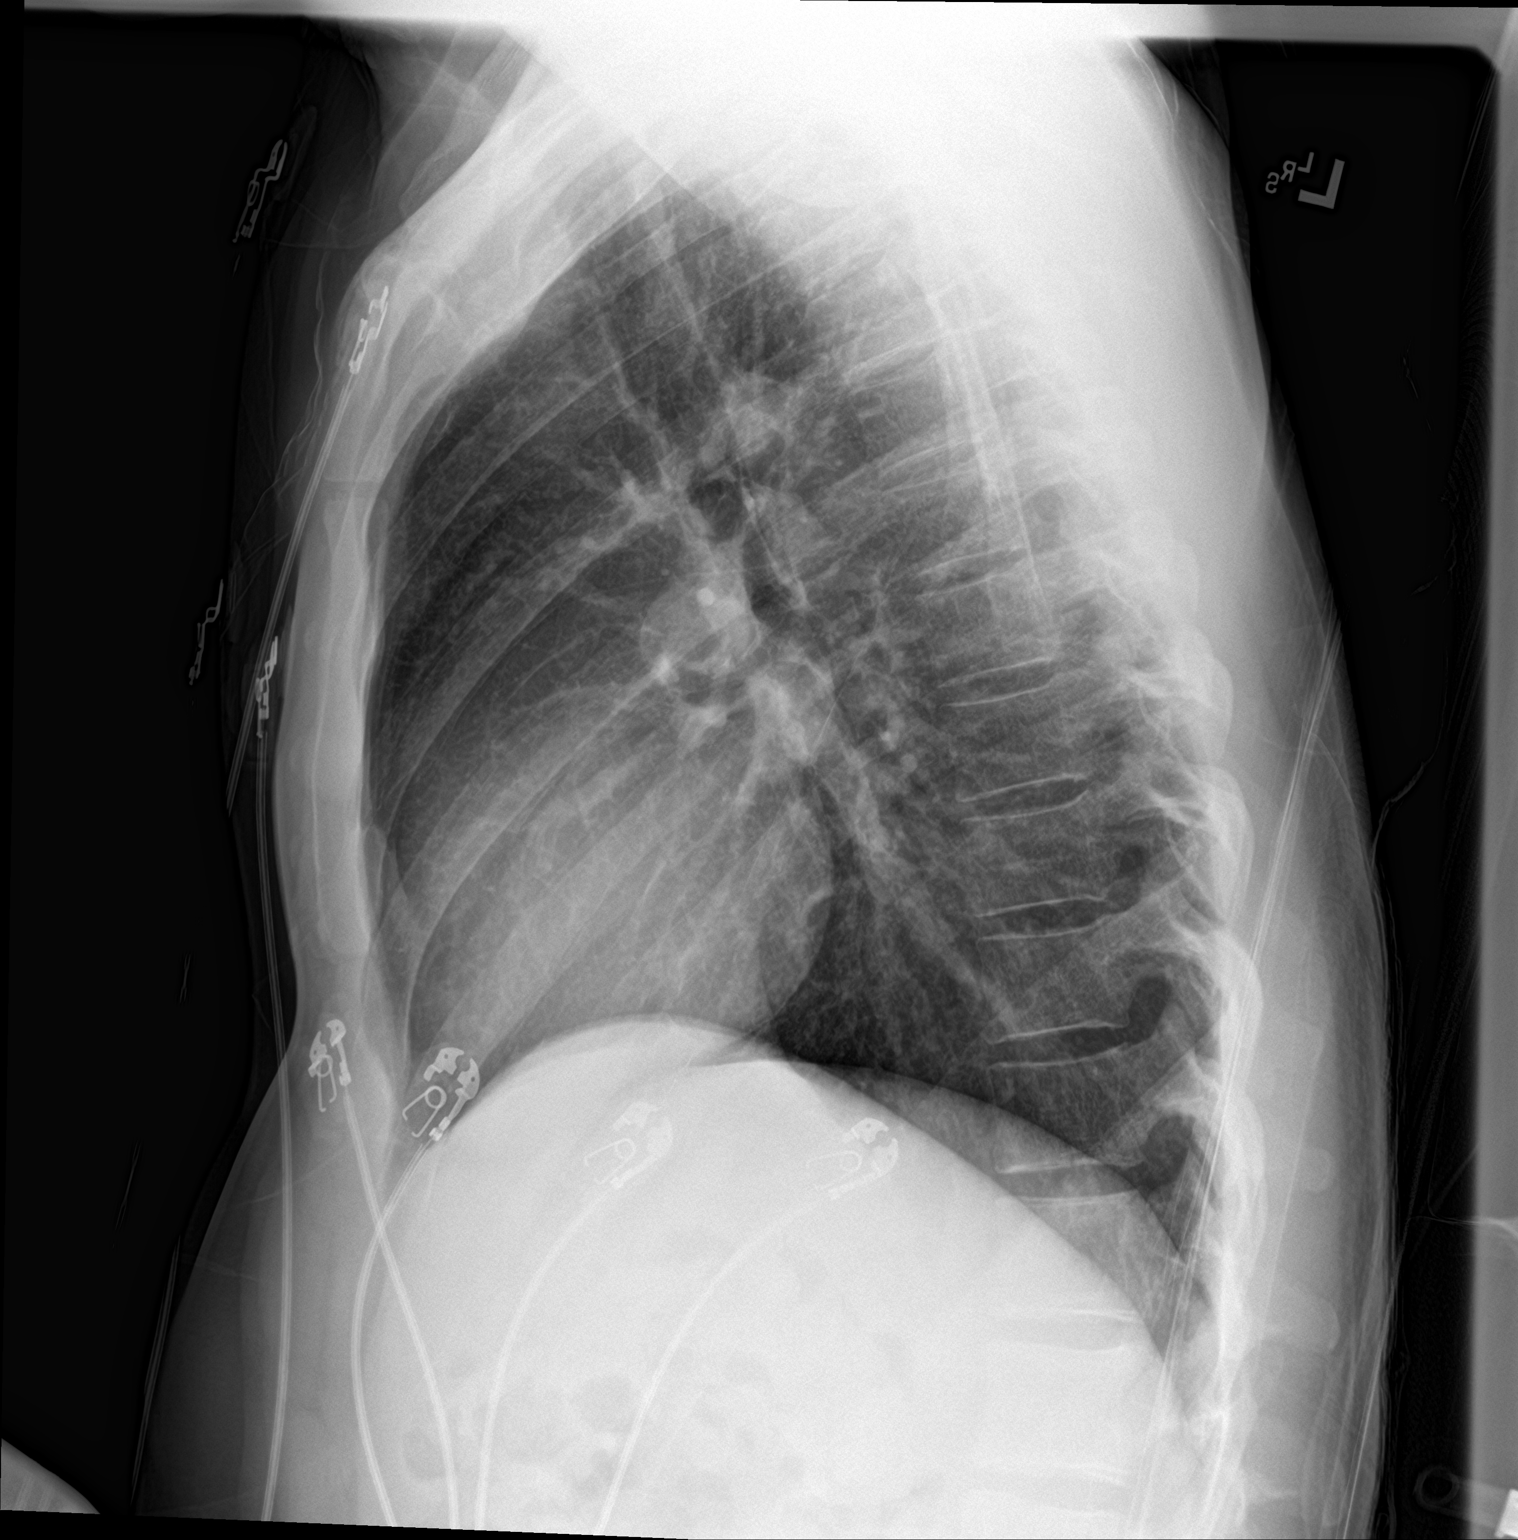

[2 of 2 positions shown; findings below may reference images not displayed]

FINDINGS: Cardiomediastinal silhouette is normal. No pleural effusions or
focal consolidations. Trachea projects midline and there is no
pneumothorax. Soft tissue planes and included osseous structures are
non-suspicious.
IMPRESSION: Normal chest.

## 2019-11-25 IMAGING — CT CT ANGIO CHEST
3 of 7 series · 19 of 36 positions shown · IV contrast (iopamidol)
Comparison: Radiographs earlier this day.  Chest CT 04/18/2017

CLINICAL DATA: Shortness of breath. Diagnosed with pulmonary
embolus 5 weeks ago.

EXAM:
CT ANGIOGRAPHY CHEST WITH CONTRAST
TECHNIQUE: Multidetector CT imaging of the chest was performed using the
standard protocol during bolus administration of intravenous
contrast. Multiplanar CT image reconstructions and MIPs were
obtained to evaluate the vascular anatomy.
CONTRAST:  100mL NBIXES-AKU IOPAMIDOL (NBIXES-AKU) INJECTION 76%

[Series 8: lung f_0.6 · axial · 0.75mm/px · z∈[+1318,+1478]mm · 3 of 162 slices shown]
[im 41/162  mediastinal]
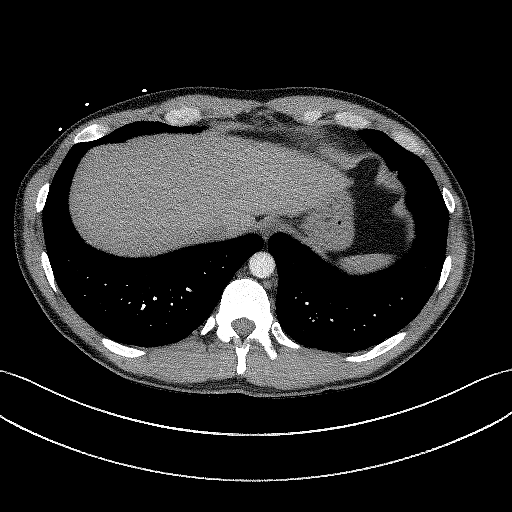
[im 81/162  mediastinal]
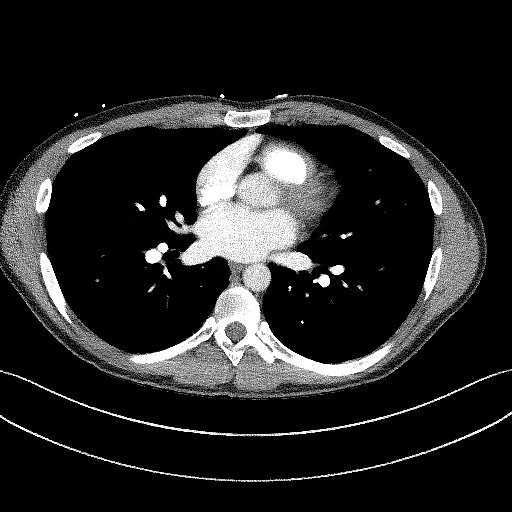
[im 121/162  mediastinal]
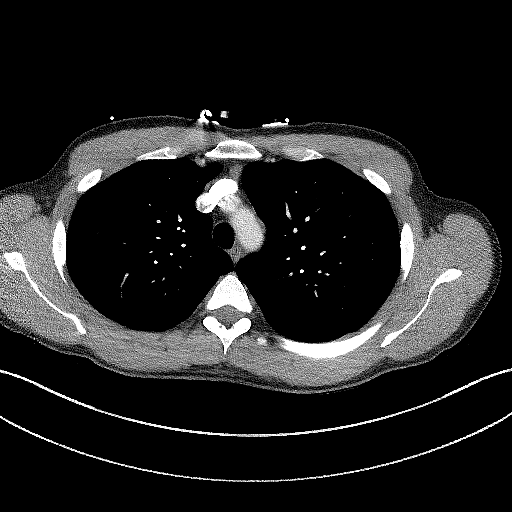

[Series 9: coronal f_0.6 · coronal · 0.70mm/px · 1 of 111 slices shown]
[im 56/111  mediastinal]
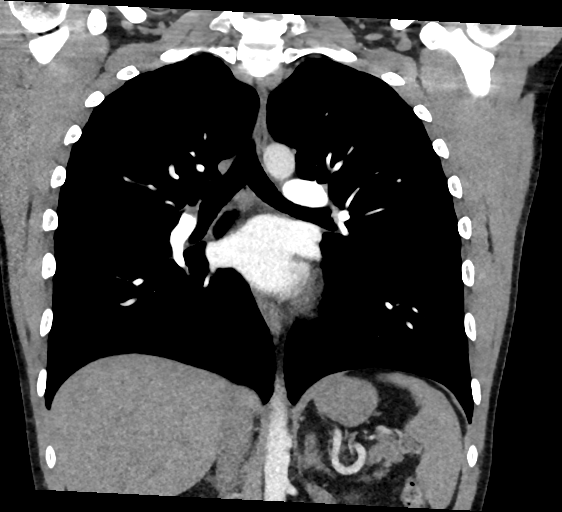

[Series 13: thins f_0.6 · axial · 0.75mm/px · z∈[+1248,+1548]mm · 15 of 491 slices shown]
[im 31/491  lung]
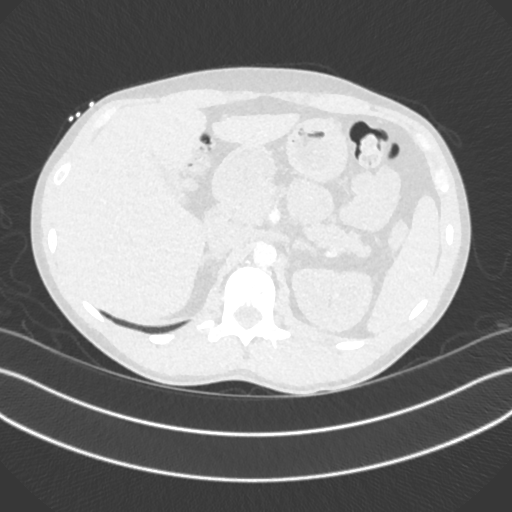
[im 62/491  mediastinal]
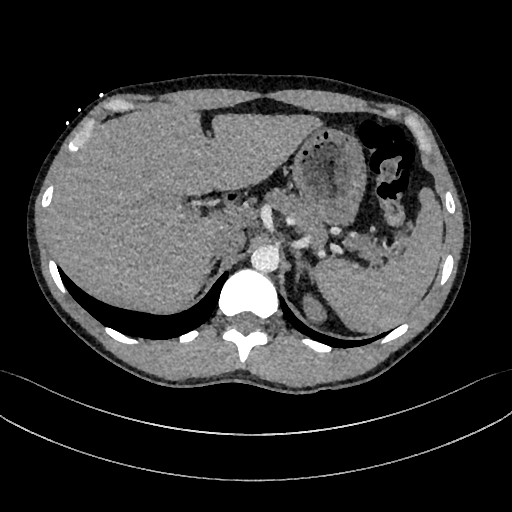
[im 92/491  lung]
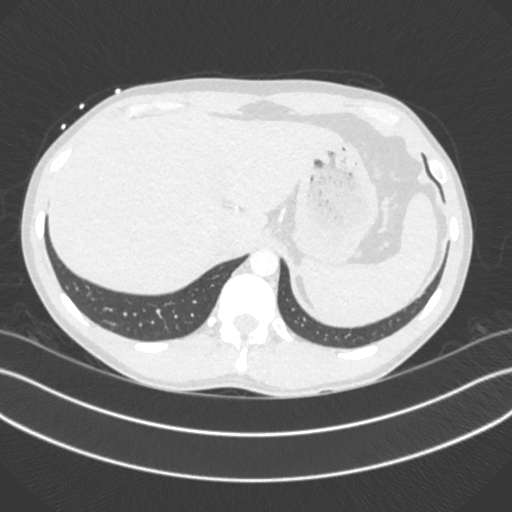
[im 123/491  mediastinal]
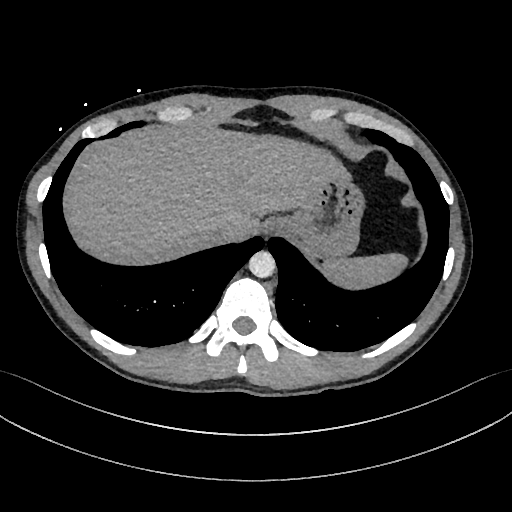
[im 154/491  lung]
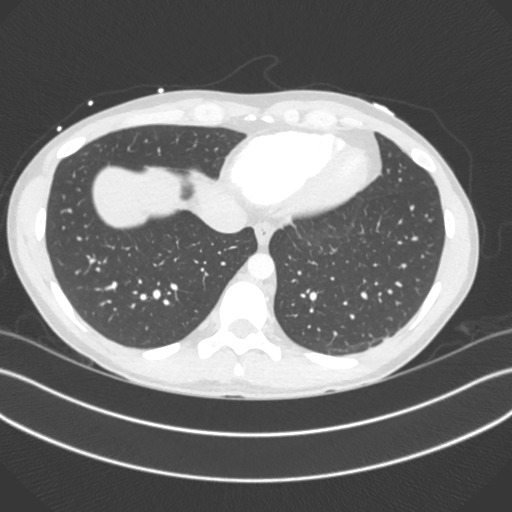
[im 184/491  mediastinal]
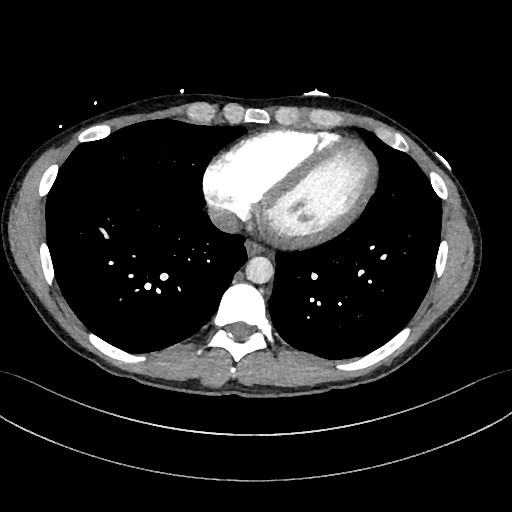
[im 215/491  lung]
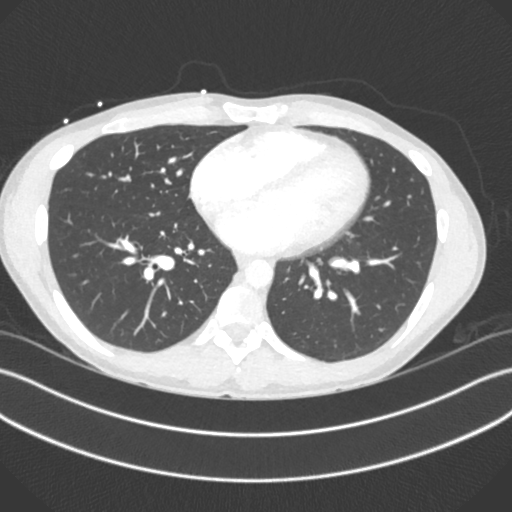
[im 246/491  mediastinal]
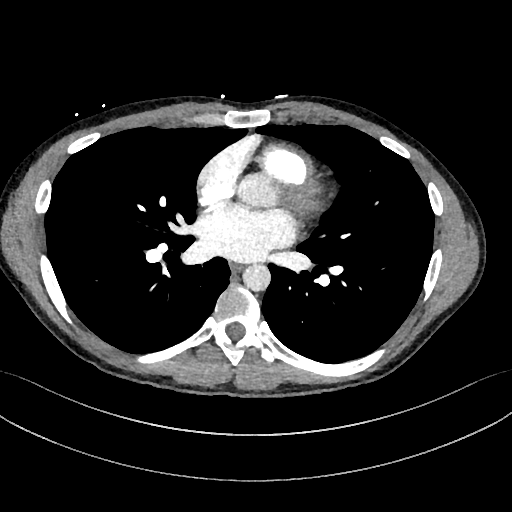
[im 276/491  lung]
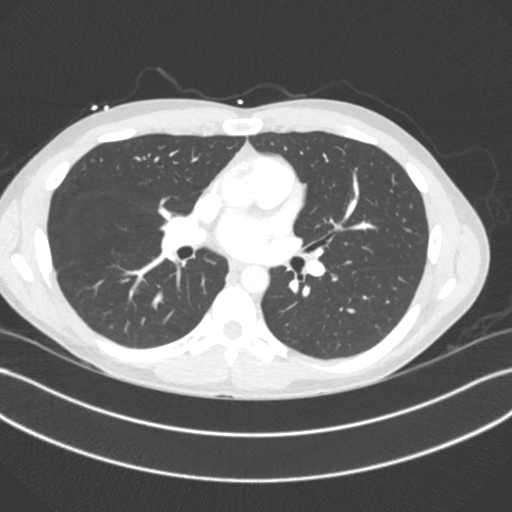
[im 307/491  mediastinal]
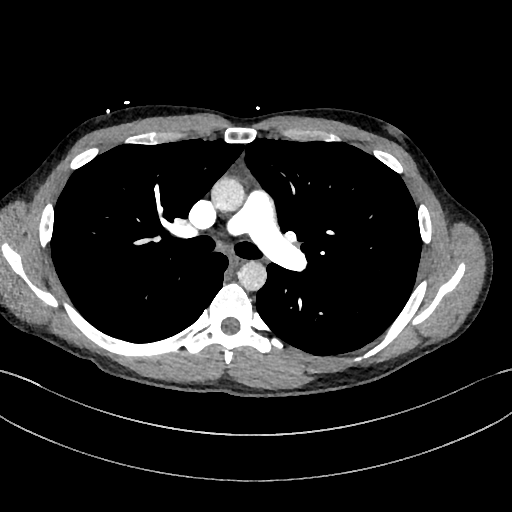
[im 337/491  lung]
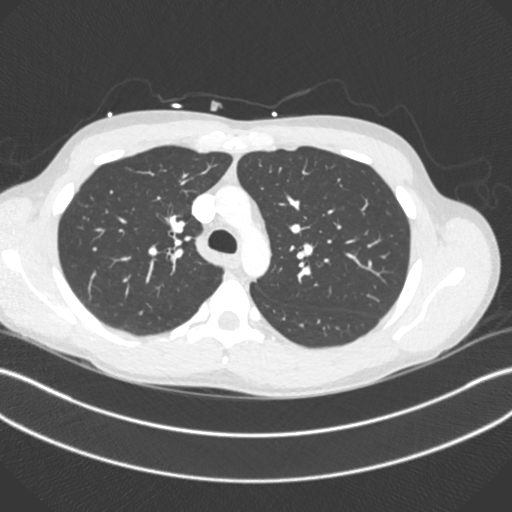
[im 368/491  mediastinal]
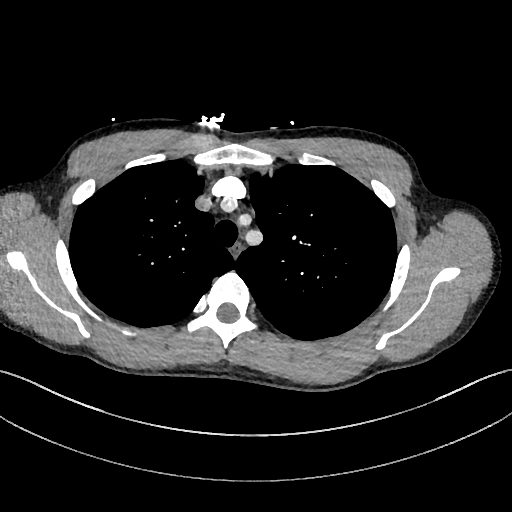
[im 399/491  lung]
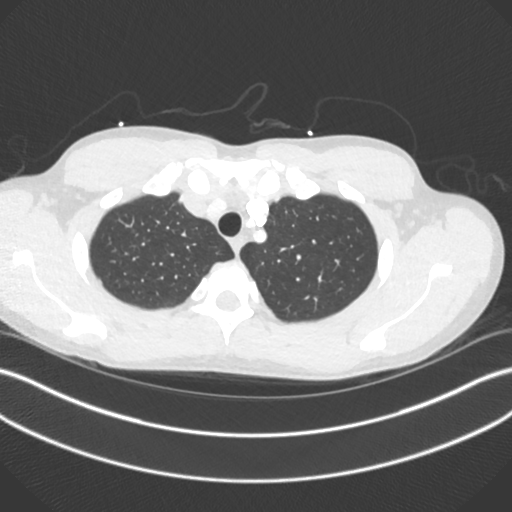
[im 429/491  mediastinal]
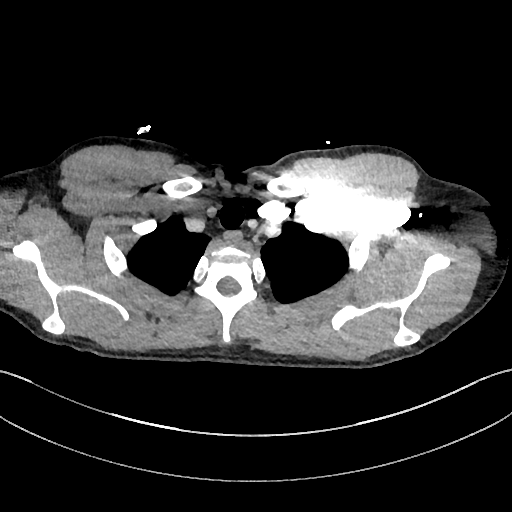
[im 460/491  lung]
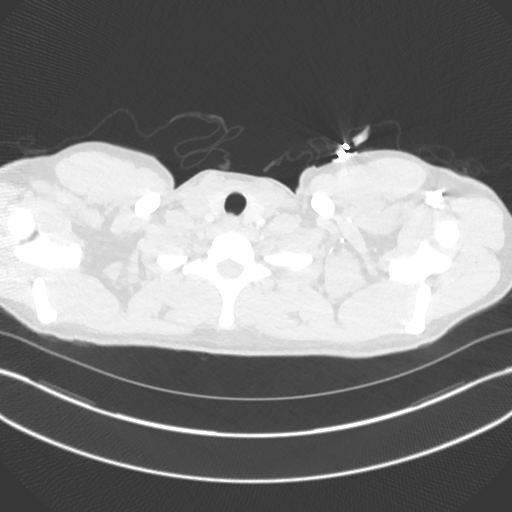

[19 of 36 positions shown; findings below may reference images not displayed]

FINDINGS: Cardiovascular: Previous left lower lobe pulmonary embolus has
resolved. No new or progressive filling defects in the pulmonary
arteries. Heart is normal in size. Thoracic aorta is normal in
caliber without dissection.

Mediastinum/Nodes: No mediastinal or hilar adenopathy. The esophagus
is decompressed. Visualized thyroid gland is normal.

Lungs/Pleura: Clear lungs. No consolidation, pleural effusion or
pulmonary edema. No pulmonary mass.

Upper Abdomen: No acute abnormality.

Musculoskeletal: There are no acute or suspicious osseous
abnormalities.

Review of the MIP images confirms the above findings.
IMPRESSION: Previous subsegmental pulmonary embolus has resolved. No new or
progressive pulmonary emboli. No acute intrathoracic abnormality.

## 2021-01-26 ENCOUNTER — Emergency Department (HOSPITAL_COMMUNITY)
Admission: EM | Admit: 2021-01-26 | Discharge: 2021-01-26 | Disposition: A | Discharge status: Home / Self Care | Payer: BC Managed Care – PPO | Attending: Emergency Medicine | Admitting: Emergency Medicine

## 2021-01-26 ENCOUNTER — Emergency Department (HOSPITAL_COMMUNITY): Payer: BC Managed Care – PPO

## 2021-01-26 ENCOUNTER — Other Ambulatory Visit: Payer: Self-pay

## 2021-01-26 ENCOUNTER — Encounter (HOSPITAL_COMMUNITY): Payer: Self-pay | Admitting: Emergency Medicine

## 2021-01-26 DIAGNOSIS — F17200 Nicotine dependence, unspecified, uncomplicated: Secondary | ICD-10-CM | POA: Diagnosis not present

## 2021-01-26 DIAGNOSIS — I10 Essential (primary) hypertension: Secondary | ICD-10-CM | POA: Insufficient documentation

## 2021-01-26 DIAGNOSIS — Z79899 Other long term (current) drug therapy: Secondary | ICD-10-CM | POA: Diagnosis not present

## 2021-01-26 DIAGNOSIS — R509 Fever, unspecified: Secondary | ICD-10-CM

## 2021-01-26 DIAGNOSIS — J069 Acute upper respiratory infection, unspecified: Secondary | ICD-10-CM | POA: Diagnosis not present

## 2021-01-26 DIAGNOSIS — R000 Tachycardia, unspecified: Secondary | ICD-10-CM | POA: Insufficient documentation

## 2021-01-26 DIAGNOSIS — Z20822 Contact with and (suspected) exposure to covid-19: Secondary | ICD-10-CM | POA: Insufficient documentation

## 2021-01-26 LAB — RESP PANEL BY RT-PCR (FLU A&B, COVID) ARPGX2
Influenza A by PCR: NEGATIVE
Influenza B by PCR: NEGATIVE
SARS Coronavirus 2 by RT PCR: NEGATIVE

## 2021-01-26 LAB — GROUP A STREP BY PCR: Group A Strep by PCR: NOT DETECTED

## 2021-01-26 MED ORDER — IBUPROFEN 200 MG PO TABS
600.0000 mg | ORAL_TABLET | Freq: Once | ORAL | Status: AC
  Administered 2021-01-26: 600 mg via ORAL
  Filled 2021-01-26: qty 3

## 2021-01-26 NOTE — Discharge Instructions (Addendum)
Thank you for allowing me to care for you today in the Emergency Department.   As we discussed, your COVID-19 test and strep throat test were negative in the emergency department today.  Your symptoms are consistent with a viral upper respiratory infection.  It is possible that it could be another viral illness, or the COVID-19 test was negative today due to your symptoms only starting approximately 24 hours ago.  You can consider retesting for COVID after your symptoms have been ongoing longer if they do not start to improve over the next few days.  You can take 1000 mg of Tylenol for fever once every 8 hours.  Do not take more than 4000 mg of Tylenol from all sources in a 24-hour period.  You can take 800 mg of ibuprofen with food once every 8 hours for fever.  You can also alternate between these 2 medications.  For instance, you can take a dose of Tylenol at noon, followed by ibuprofen at 4, followed by second dose of Tylenol at 8.  Return to the emergency department if you develop respiratory distress, if you become unable to swallow, feels as if your throat is closing, if you become very sleepy and hard to wake up, have uncontrollable vomiting, stop making urine, or have other new, severe, concerning symptoms.

## 2021-01-26 NOTE — ED Provider Notes (Signed)
[facility]-EMERGENCY DEPT Provider Note   CSN: [id number] Arrival date & time: 01/26/21  0146     History No chief complaint on file.   Cody Fisher is a 35 y.o. male with a history of provoked PE, HTN, GERD who presents the emergency department with a chief complaint of fever.  The patient reports sore throat, headache, nasal congestion, fever, and myalgias, onset 24 hours ago.  No known aggravating or alleviating factors.  He denies dysphagia, muffled voice, neck pain or stiffness, visual changes, numbness, weakness, dysuria, hematuria, abdominal pain, nausea, vomiting, diarrhea, chest pain, shortness of breath, cough, rash.   He denies known sick contacts.  He has a fully vaccinated and booster against COVID-19.  He took a PCR test at [facility] earlier today, but results would not be available for 2 days.  He has taken 500 mg of Tylenol, last dose around midnight.  He has taken a total of 4 doses of Tylenol over the last 24 hours.  No other treatment prior to arrival.  Due to persistent fever and development of myalgias, he came to the emergency department for further evaluation.  The history is provided by the patient and medical records. No language interpreter was used.      Past Medical History:  Diagnosis Date   Chest pain    Essential hypertension 06/03/2013   GERD (gastroesophageal reflux disease)    Heart murmur    As a Child   Hypertension    Borderline    Patient Active Problem List   Diagnosis Date Noted   Pulmonary embolus (HCC) 04/18/2017   Left tibial fracture 03/24/2017   Fracture of tibial shaft, left, closed 03/22/2017   Essential hypertension 06/03/2013   Chest pain 03/09/2013    Past Surgical History:  Procedure Laterality Date   TIBIA IM NAIL INSERTION Left 03/23/2017   Procedure: INTRAMEDULLARY (IM) NAIL TIBIAL;  Surgeon: [name], MD;  Location: [facility] OR;  Service: Orthopedics;  Laterality: Left;       Family History   Problem Relation Age of Onset   Diabetes Unknown        family history   Hypertension Mother    Diabetes Father     Social History   Tobacco Use   Smoking status: Some Days   Smokeless tobacco: Never   Tobacco comments:    Quit 2014  Vaping Use   Vaping Use: Never used  Substance Use Topics   Alcohol use: Yes    Comment: occ   Drug use: No    Home Medications Prior to Admission medications   Medication Sig Start Date End Date Taking? Authorizing Provider  acetaminophen (TYLENOL) 500 MG tablet Take 500 mg by mouth every 6 (six) hours as needed for fever or moderate pain.   Yes [provider]  ALPRAZolam Prudy Feeler) 0.5 MG tablet Take 0.5 mg by mouth 3 (three) times daily as needed for anxiety.   Yes [provider]  amLODipine (NORVASC) 5 MG tablet Take 1 tablet (5 mg total) by mouth daily. Please make 2 year appt with Dr. Delton See for February for future refills. 1st attempt 06/26/17  Yes Lars Masson, MD  Multiple Vitamins-Minerals (MULTIVITAMIN WITH MINERALS) tablet Take 1 tablet by mouth daily.   Yes [provider]  Omega-3 Fatty Acids (FISH OIL) 1000 MG CAPS Take 1,000 mg by mouth daily.    Yes [provider]  omeprazole (PRILOSEC) 40 MG capsule Take 40 mg by mouth daily.  Yes [provider]  OVER THE COUNTER MEDICATION Place 15 mg under the tongue daily. CBD oil   Yes [provider]  sertraline (ZOLOFT) 50 MG tablet Take 50 mg by mouth daily.   Yes [provider]    Allergies    Patient has no known allergies.  Review of Systems   Review of Systems  Constitutional:  Positive for fever. Negative for appetite change, chills and diaphoresis.  HENT:  Positive for congestion, sinus pressure, sinus pain and sore throat. Negative for ear discharge, ear pain, facial swelling, mouth sores, postnasal drip, trouble swallowing and voice change.   Eyes:  Negative for photophobia and visual disturbance.   Respiratory:  Negative for apnea, cough, shortness of breath and wheezing.   Cardiovascular:  Negative for chest pain and palpitations.  Gastrointestinal:  Negative for abdominal pain, constipation, diarrhea, nausea and vomiting.  Genitourinary:  Negative for dysuria, frequency, hematuria, penile pain, penile swelling and urgency.  Musculoskeletal:  Positive for myalgias. Negative for arthralgias, back pain, gait problem, neck pain and neck stiffness.  Skin:  Negative for rash.  Allergic/Immunologic: Negative for immunocompromised state.  Neurological:  Positive for headaches. Negative for dizziness, seizures, syncope, weakness and numbness.  Psychiatric/Behavioral:  Negative for confusion.    Physical Exam Updated Vital Signs BP 137/74   Pulse 96   Temp (!) 101.3 F (38.5 C) (Oral)   Resp 16   Ht 5\' 10"  (1.778 m)   Wt 81.6 kg   SpO2 96%   BMI 25.83 kg/m   Physical Exam Vitals and nursing note reviewed.  Constitutional:      Appearance: Normal appearance. He is well-developed. He is not ill-appearing, toxic-appearing or diaphoretic.  HENT:     Head: Normocephalic.     Right Ear: Tympanic membrane, ear canal and external ear normal.     Nose: Congestion present. No rhinorrhea.     Comments: Mucosa is erythematous.  Congestion is noted bilaterally.    Mouth/Throat:     Pharynx: Posterior oropharyngeal erythema present. No oropharyngeal exudate.     Comments: Posterior oropharynx is erythematous.  Uvula is midline.  No exudates.  Tolerating secretions without difficulty. Eyes:     Conjunctiva/sclera: Conjunctivae normal.  Neck:     Comments: No meningismus. Cardiovascular:     Rate and Rhythm: Regular rhythm. Tachycardia present.     Pulses: Normal pulses.     Heart sounds: Normal heart sounds. No murmur heard.   No friction rub. No gallop.  Pulmonary:     Effort: Pulmonary effort is normal. No respiratory distress.     Breath sounds: No stridor. No wheezing, rhonchi or  rales.     Comments: Lungs are clear to auscultation bilaterally.  No increased work of breathing. Chest:     Chest wall: No tenderness.  Abdominal:     General: There is no distension.     Palpations: Abdomen is soft. There is no mass.     Tenderness: There is no abdominal tenderness. There is no right CVA tenderness, left CVA tenderness, guarding or rebound.     Hernia: No hernia is present.     Comments: Abdomen is soft, nontender, nondistended.  Musculoskeletal:     Cervical back: Neck supple.     Right lower leg: No edema.     Left lower leg: No edema.  Skin:    General: Skin is warm and dry.     Coloration: Skin is not jaundiced or pale.  Findings: No rash.  Neurological:     Mental Status: He is alert.  Psychiatric:        Behavior: Behavior normal.    ED Results / Procedures / Treatments   Labs (all labs ordered are listed, but only abnormal results are displayed) Labs Reviewed  RESP PANEL BY RT-PCR (FLU A&B, COVID) [id number]  GROUP A STREP BY PCR    EKG EKG Interpretation  Date/Time:  Saturday January 26 2021 01:56:42 EDT Ventricular Rate:  148 PR Interval:  136 QRS Duration: 84 QT Interval:  270 QTC Calculation: 423 R Axis:   125 Text Interpretation: Sinus tachycardia Left posterior fascicular block Cannot rule out Anterior infarct , age undetermined Abnormal ECG Confirmed by Zadie Rhine ([id number]) on 01/26/2021 2:10:38 AM  Radiology DG Chest Port 1 View  Result Date: 01/26/2021 CLINICAL DATA:  Chest pain EXAM: PORTABLE CHEST 1 VIEW COMPARISON:  05/30/2017 FINDINGS: Lungs are clear.  No pleural effusion or pneumothorax. The heart is normal in size. IMPRESSION: No evidence of acute cardiopulmonary disease. Electronically Signed   By: Charline Bills M.D.   On: 01/26/2021 02:44    Procedures Procedures   Medications Ordered in ED Medications  ibuprofen (ADVIL) tablet 600 mg (600 mg Oral Given 01/26/21 0310)    ED Course  I have reviewed the triage  vital signs and the nursing notes.  Pertinent labs & imaging results that were available during my care of the patient were reviewed by me and considered in my medical decision making (see chart for details).    MDM Rules/Calculators/A&P                           35 year old male with a history of provoked PE, HTN, GERD who presents the emergency department from home with a chief complaint of fever.  The patient has had a 24-hour history of fever, sore throat, headache, sinus pain and pressure, congestion, and myalgias.  No other associated symptoms.  Febrile to 101.3 on arrival with heart rate in the 130s.  No hypoxia or tachypnea.  On exam, patient is nontoxic appearing.  He has had normal urine output and has been hydrating well as he has had no nausea or vomiting.  Suspicious for viral URI versus streptococcal pharyngitis.  Labs have been reviewed and independently interpreted by me.  COVID-19 test is negative.  Strep PCR test is negative.  EKG with sinus tachycardia.  Chest x-ray is unremarkable.  On reevaluation, patient is defervesced.  Heart rate has normalized.  He reports that he is feeling markedly improved from arrival.  Discussed with patient that he could have COVID-19, but may have tested too early.  Could also consider other viral URI.  I have a low suspicion for sepsis, bacteremia, meningitis, intra-abdominal infection, bacterial pneumonia, GU infection, acute bacterial sinusitis.  At this time, the patient is hemodynamically stable in no acute distress.  Home supportive care discussed.  He is safe for discharged home with outpatient follow-up as needed.  ER return precautions given.  Final Clinical Impression(s) / ED Diagnoses Final diagnoses:  Viral URI  Fever of unknown origin    Rx / DC Orders ED Discharge Orders     None        Shanera Meske A, PA-C 01/26/21 0700    Zadie Rhine, MD 01/26/21 2308

## 2021-01-26 NOTE — ED Triage Notes (Signed)
Pt states he has had a fever and generalized body aches for the past few days. Pt is tachycardic at 138. Pt reports taking a covid test at [facility] but not having results yet.

## 2021-06-10 ENCOUNTER — Encounter (HOSPITAL_BASED_OUTPATIENT_CLINIC_OR_DEPARTMENT_OTHER): Payer: Self-pay | Admitting: Obstetrics and Gynecology

## 2021-06-10 ENCOUNTER — Other Ambulatory Visit: Payer: Self-pay

## 2021-06-10 DIAGNOSIS — Z5321 Procedure and treatment not carried out due to patient leaving prior to being seen by health care provider: Secondary | ICD-10-CM | POA: Diagnosis not present

## 2021-06-10 DIAGNOSIS — U071 COVID-19: Secondary | ICD-10-CM | POA: Diagnosis not present

## 2021-06-10 DIAGNOSIS — R509 Fever, unspecified: Secondary | ICD-10-CM | POA: Diagnosis present

## 2021-06-10 LAB — RESP PANEL BY RT-PCR (FLU A&B, COVID) ARPGX2
Influenza A by PCR: NEGATIVE
Influenza B by PCR: NEGATIVE
SARS Coronavirus 2 by RT PCR: POSITIVE — AB

## 2021-06-10 MED ORDER — ACETAMINOPHEN 500 MG PO TABS
1000.0000 mg | ORAL_TABLET | Freq: Once | ORAL | Status: AC
  Administered 2021-06-10: 22:00:00 1000 mg via ORAL
  Filled 2021-06-10: qty 2

## 2021-06-10 MED ORDER — BENZONATATE 100 MG PO CAPS
200.0000 mg | ORAL_CAPSULE | Freq: Once | ORAL | Status: AC
  Administered 2021-06-10: 22:00:00 200 mg via ORAL
  Filled 2021-06-10: qty 2

## 2021-06-10 NOTE — ED Triage Notes (Signed)
Patient reports to the ER for being COVID+. Patient states he wants to confirm COVID test. Patient reports a hx of PE. Reports he "feels like crap" and has body aches and fevers.

## 2021-06-11 ENCOUNTER — Emergency Department (HOSPITAL_BASED_OUTPATIENT_CLINIC_OR_DEPARTMENT_OTHER)
Admission: EM | Admit: 2021-06-11 | Discharge: 2021-06-11 | Discharge status: Against Medical Advice | Payer: BC Managed Care – PPO | Attending: Emergency Medicine | Admitting: Emergency Medicine

## 2021-06-11 DIAGNOSIS — Z20822 Contact with and (suspected) exposure to covid-19: Secondary | ICD-10-CM

## 2021-06-11 NOTE — ED Provider Notes (Signed)
Blood pressure 121/63, pulse (!) 148, temperature (!) 103 F (39.4 C), resp. rate (!) 26, height 5\' 10"  (1.778 m), weight 90.7 kg, SpO2 98 %.   In short, Cody Fisher is a 35 y.o. male with a chief complaint of Covid Positive  Patient was not evaluated during his emergency department visit.  He was seen in triage where a COVID and flu PCR was sent.  Patient ultimately left to the emergency department prior to being evaluated by me.     31, MD 06/11/21 [phone]

## 2021-08-21 DIAGNOSIS — K760 Fatty (change of) liver, not elsewhere classified: Secondary | ICD-10-CM | POA: Diagnosis not present

## 2021-08-21 DIAGNOSIS — Z0000 Encounter for general adult medical examination without abnormal findings: Secondary | ICD-10-CM | POA: Diagnosis not present

## 2021-08-21 DIAGNOSIS — I10 Essential (primary) hypertension: Secondary | ICD-10-CM | POA: Diagnosis not present

## 2021-08-21 DIAGNOSIS — K219 Gastro-esophageal reflux disease without esophagitis: Secondary | ICD-10-CM | POA: Diagnosis not present

## 2021-08-21 DIAGNOSIS — F411 Generalized anxiety disorder: Secondary | ICD-10-CM | POA: Diagnosis not present

## 2022-07-04 DIAGNOSIS — Z0000 Encounter for general adult medical examination without abnormal findings: Secondary | ICD-10-CM | POA: Diagnosis not present

## 2022-07-04 DIAGNOSIS — I10 Essential (primary) hypertension: Secondary | ICD-10-CM | POA: Diagnosis not present

## 2022-07-04 DIAGNOSIS — K219 Gastro-esophageal reflux disease without esophagitis: Secondary | ICD-10-CM | POA: Diagnosis not present

## 2022-07-04 DIAGNOSIS — Z13220 Encounter for screening for lipoid disorders: Secondary | ICD-10-CM | POA: Diagnosis not present

## 2022-07-04 DIAGNOSIS — F411 Generalized anxiety disorder: Secondary | ICD-10-CM | POA: Diagnosis not present

## 2022-07-04 DIAGNOSIS — E782 Mixed hyperlipidemia: Secondary | ICD-10-CM | POA: Diagnosis not present
